# Patient Record
Sex: Male | Born: 1955 | Race: White | Hispanic: No | Marital: Married | Smoking: Current every day smoker
Health system: Southern US, Community
[De-identification: ages and names within clinical notes are randomized; demographics above are authoritative.]

## PROBLEM LIST (undated history)

## (undated) DIAGNOSIS — E7800 Pure hypercholesterolemia, unspecified: Secondary | ICD-10-CM

## (undated) DIAGNOSIS — E785 Hyperlipidemia, unspecified: Secondary | ICD-10-CM

## (undated) DIAGNOSIS — H409 Unspecified glaucoma: Secondary | ICD-10-CM

## (undated) DIAGNOSIS — C61 Malignant neoplasm of prostate: Secondary | ICD-10-CM

## (undated) HISTORY — DX: Malignant neoplasm of prostate: C61

## (undated) HISTORY — PX: HERNIA REPAIR: SHX51

## (undated) HISTORY — PX: APPENDECTOMY: SHX54

## (undated) HISTORY — DX: Hyperlipidemia, unspecified: E78.5

## (undated) HISTORY — DX: Unspecified glaucoma: H40.9

## (undated) HISTORY — PX: PROSTATECTOMY: SHX69

---

## 2009-01-11 ENCOUNTER — Ambulatory Visit: Payer: Self-pay | Admitting: Gastroenterology

## 2009-12-09 ENCOUNTER — Inpatient Hospital Stay: Payer: Self-pay | Admitting: Internal Medicine

## 2009-12-09 IMAGING — CR DG CHEST 2V
1 series · 2 of 2 positions shown · non-contrast
Comparison: none

REASON FOR EXAM: syncope  AMS
COMMENTS:

[Series 1: view not recorded · 0.17mm/px · 2 of 2 slices shown]
[im 1/2]
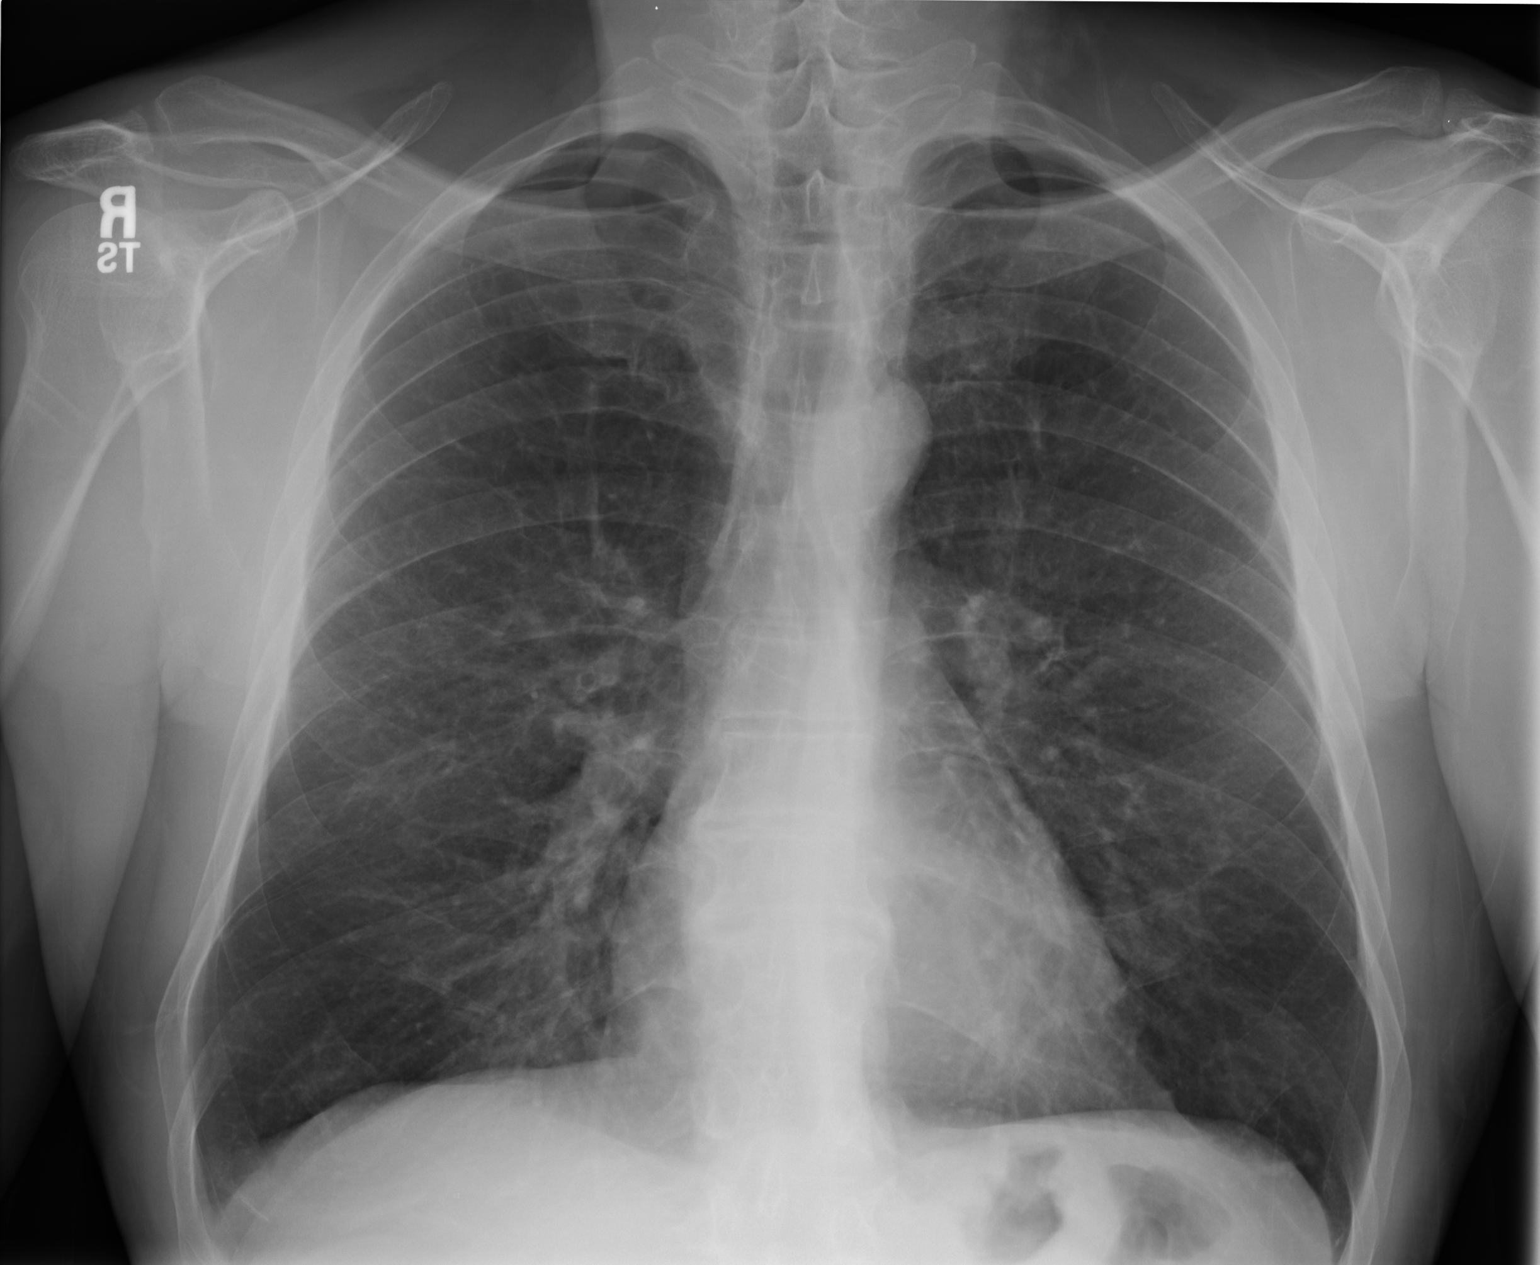
[im 2/2]
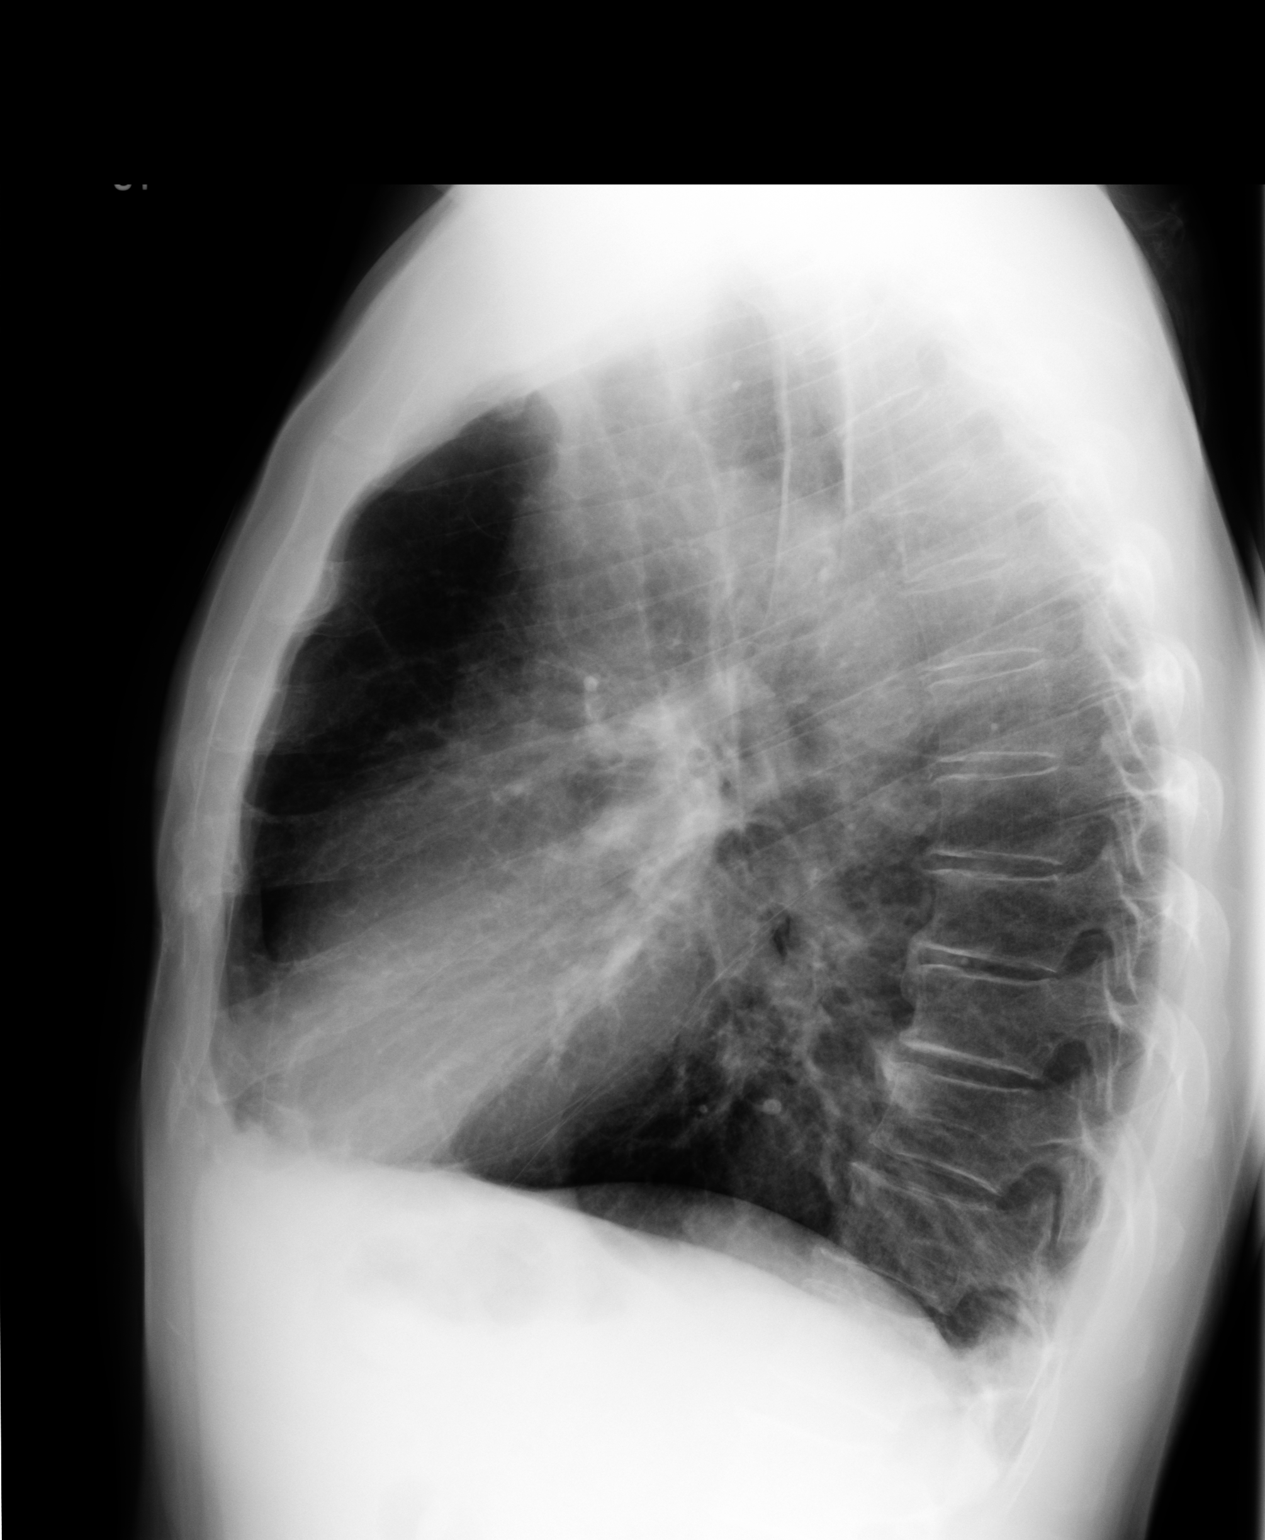

[2 of 2 positions shown; findings below may reference images not displayed]

PROCEDURE:     DXR - DXR CHEST PA (OR AP) AND LATERAL  - [DATE]  [DATE]

RESULT:     PA and lateral views of the chest were obtained. There are no
prior chest radiographs for comparison. The current exam shows the lung
fields to be clear of infiltrate. There is mild thickening of the
interstitial lung markings, consistent with interstitial fibrotic change.
The chest is hyperexpanded compatible with a history of COPD or asthma.
Heart size is normal. No acute bony abnormalities are seen.
IMPRESSION: 1. No acute changes are identified.
2. The chest is hyperexpanded compatible with a history of COPD or asthma.
3. There is mild thickening of the interstitial lung markings bilaterally,
consistent with mild interstitial fibrotic change.

## 2015-08-13 ENCOUNTER — Encounter: Payer: Self-pay | Admitting: Emergency Medicine

## 2015-08-13 ENCOUNTER — Emergency Department
Admission: EM | Admit: 2015-08-13 | Discharge: 2015-08-13 | Disposition: A | Discharge status: Home / Self Care | Payer: Managed Care, Other (non HMO)

## 2015-08-13 NOTE — ED Notes (Signed)
Patient reminded to wash his hands. Patient and wife placed in Family Waiting Room

## 2015-08-13 NOTE — ED Notes (Signed)
Patient has summary from [facility] with him. Diagnosis: Zoster

## 2015-08-13 NOTE — ED Notes (Signed)
Pt with rash right side top of head, right eye.

## 2015-08-13 NOTE — ED Notes (Signed)
Patient here complaining of shingles, diagnosed at [facility].   Patient states "the pain pills they gave me just aren't cutting it". Patient states he was told to see an opthamolgist, or his PCP.  Patient has no PCP. Here for pain control and to see an opthamologist. Right eyelid swollen with drainage.  Healing rash on forehead. Visual acuity R eye 20/50, left eye 20/30

## 2015-08-13 NOTE — ED Notes (Signed)
PA in to see pt. pts wife stated that they were here to see ophthalmologist . Stated that they had called (did not specify who or where) and were told that there was an ophthalmologist here. Were very upset that there was no ophthalmologist here, that they were on call, and pt would have to be seen by ER MD first to get a referral. Asked for their co-pay back, it was refunded, and they left before being seen.  When I assessed pt., wife told me that [facility] and were told to see an ophthalmologist. They said they did not have one and were then told to come to the ER. Wife told same story to triage.

## 2015-10-25 ENCOUNTER — Encounter: Payer: Self-pay | Admitting: *Deleted

## 2015-10-28 ENCOUNTER — Ambulatory Visit: Payer: Managed Care, Other (non HMO) | Admitting: Anesthesiology

## 2015-10-28 ENCOUNTER — Encounter: Payer: Self-pay | Admitting: *Deleted

## 2015-10-28 ENCOUNTER — Encounter
Admission: RE | Disposition: A | Discharge status: Home / Self Care | Payer: Self-pay | Source: Ambulatory Visit | Attending: Gastroenterology

## 2015-10-28 ENCOUNTER — Ambulatory Visit
Admission: RE | Admit: 2015-10-28 | Discharge: 2015-10-28 | Disposition: A | Discharge status: Home / Self Care | Payer: Managed Care, Other (non HMO) | Source: Ambulatory Visit | Attending: Gastroenterology | Admitting: Gastroenterology

## 2015-10-28 DIAGNOSIS — F17200 Nicotine dependence, unspecified, uncomplicated: Secondary | ICD-10-CM | POA: Insufficient documentation

## 2015-10-28 DIAGNOSIS — Z7982 Long term (current) use of aspirin: Secondary | ICD-10-CM | POA: Diagnosis not present

## 2015-10-28 DIAGNOSIS — K642 Third degree hemorrhoids: Secondary | ICD-10-CM | POA: Diagnosis not present

## 2015-10-28 DIAGNOSIS — E7800 Pure hypercholesterolemia, unspecified: Secondary | ICD-10-CM | POA: Diagnosis not present

## 2015-10-28 DIAGNOSIS — Z86010 Personal history of colonic polyps: Secondary | ICD-10-CM | POA: Diagnosis not present

## 2015-10-28 DIAGNOSIS — Z79899 Other long term (current) drug therapy: Secondary | ICD-10-CM | POA: Insufficient documentation

## 2015-10-28 DIAGNOSIS — Z1211 Encounter for screening for malignant neoplasm of colon: Secondary | ICD-10-CM | POA: Insufficient documentation

## 2015-10-28 DIAGNOSIS — K635 Polyp of colon: Secondary | ICD-10-CM | POA: Insufficient documentation

## 2015-10-28 HISTORY — PX: COLONOSCOPY WITH PROPOFOL: SHX5780

## 2015-10-28 HISTORY — DX: Pure hypercholesterolemia, unspecified: E78.00

## 2015-10-28 SURGERY — COLONOSCOPY WITH PROPOFOL
Anesthesia: General

## 2015-10-28 MED ORDER — PROPOFOL 10 MG/ML IV BOLUS
INTRAVENOUS | Status: DC | PRN
  Administered 2015-10-28: 30 mg via INTRAVENOUS
  Administered 2015-10-28: 20 mg via INTRAVENOUS

## 2015-10-28 MED ORDER — PHENYLEPHRINE HCL 10 MG/ML IJ SOLN
INTRAMUSCULAR | Status: DC | PRN
  Administered 2015-10-28: 100 ug via INTRAVENOUS

## 2015-10-28 MED ORDER — MIDAZOLAM HCL 2 MG/2ML IJ SOLN
INTRAMUSCULAR | Status: DC | PRN
  Administered 2015-10-28: 1 mg via INTRAVENOUS

## 2015-10-28 MED ORDER — FENTANYL CITRATE (PF) 100 MCG/2ML IJ SOLN
INTRAMUSCULAR | Status: DC | PRN
  Administered 2015-10-28: 50 ug via INTRAVENOUS

## 2015-10-28 MED ORDER — SODIUM CHLORIDE 0.9 % IV SOLN
INTRAVENOUS | Status: DC
  Administered 2015-10-28 (×2): via INTRAVENOUS

## 2015-10-28 MED ORDER — PROPOFOL 500 MG/50ML IV EMUL
INTRAVENOUS | Status: DC | PRN
  Administered 2015-10-28: 100 ug/kg/min via INTRAVENOUS

## 2015-10-28 NOTE — H&P (Signed)
Outpatient short stay form Pre-procedure 10/28/2015 1:42 PM Lollie Sails MD  Primary Physician: Dr. Frazier Richards  Reason for visit:  Colonoscopy  History of present illness:  Patient is a 60 year old male with a personal history of adenomatous colon polyps. Presenting today for repeat procedure. He tolerated his prep well. He takes no aspirin or    Current facility-administered medications:  .  0.9 %  sodium chloride infusion, , Intravenous, Continuous, Lollie Sails, MD, Last Rate: 20 mL/hr at 10/28/15 1257  Prescriptions prior to admission  Medication Sig Dispense Refill Last Dose  . aspirin 81 MG tablet Take 81 mg by mouth daily.   Past Week at Unknown time  . azelastine (ASTELIN) 0.1 % nasal spray Place into both nostrils 2 (two) times daily. Use in each nostril as directed   Past Week at Unknown time  . pravastatin (PRAVACHOL) 40 MG tablet Take 40 mg by mouth daily.   Past Week at Unknown time  . valACYclovir (VALTREX) 1000 MG tablet Take 1,000 mg by mouth 3 (three) times daily.   Past Month at Unknown time  . cetirizine (ZYRTEC) 10 MG tablet Take 10 mg by mouth daily. Reported on 10/28/2015   Not Taking at Unknown time  . finasteride (PROSCAR) 5 MG tablet Take 5 mg by mouth daily. Reported on 10/28/2015   Not Taking at Unknown time  . traMADol (ULTRAM) 50 MG tablet Take 50 mg by mouth every 4 (four) hours. Reported on 10/28/2015   Not Taking at Unknown time     Allergies  Allergen Reactions  . Percocet [Oxycodone-Acetaminophen] Other (See Comments)    Dizzy,and clammy     Past Medical History  Diagnosis Date  . Hypercholesteremia     Review of systems:      Physical Exam    Heart and lungs: Regular rate and rhythm without rub or gallop, lungs are bilaterally clear.    HEENT: Normocephalic atraumatic eyes are anicteric    Other:     Pertinant exam for procedure: Soft nontender nondistended bowel sounds positive normoactive.    Planned proceedures:  Colonoscopy and indicated procedures.Telephone GI clinic in one week for pathology results    Lollie Sails, MD Gastroenterology 10/28/2015  1:42 PM

## 2015-10-28 NOTE — Op Note (Signed)
[facility]astroenterology Patient Name: Aaron Barron Procedure Date: 10/28/2015 1:03 PM MRN: [id number] Account #: [id number] Date of Birth: 09-Jul-1956 Admit Type: Outpatient Age: 60 Room: [facility] ENDO ROOM 3 Gender: Male Note Status: Finalized Procedure:            Colonoscopy Indications:          Personal history of colonic polyps Providers:            Lollie Sails, MD Referring MD:         Ocie Cornfield. Ouida Sills, MD (Referring MD) Medicines:            Monitored Anesthesia Care Complications:        No immediate complications. Procedure:            Pre-Anesthesia Assessment:                       - ASA Grade Assessment: II - A patient with mild                        systemic disease.                       After obtaining informed consent, the colonoscope was                        passed under direct vision. Throughout the procedure,                        the patient's blood pressure, pulse, and oxygen                        saturations were monitored continuously. The                        Colonoscope was introduced through the anus and                        advanced to the the cecum, identified by appendiceal                        orifice and ileocecal valve. The colonoscopy was                        performed without difficulty. The patient tolerated the                        procedure well. The quality of the bowel preparation                        was fair. Findings:      Three sessile polyps were found in the distal sigmoid colon. The polyps       were less than 1 mm in size. These polyps were removed with a cold       biopsy forceps. Resection and retrieval were complete.      Non-bleeding internal hemorrhoids were found during perianal exam and       during anoscopy. The hemorrhoids were small and Grade III (internal       hemorrhoids that prolapse but require manual reduction).      The exam was otherwise without  abnormality. Impression:           -  Preparation of the colon was fair.                       - Three less than 1 mm polyps in the distal sigmoid                        colon, removed with a cold biopsy forceps. Resected and                        retrieved.                       - Non-bleeding internal hemorrhoids.                       - The examination was otherwise normal. Recommendation:       - Discharge patient to home.                       - Use Analpram HC Cream 2.5%: Apply externally TID for                        10 days.                       - Return to GI clinic in 4 weeks. Procedure Code(s):    --- Professional ---                       [phone], Colonoscopy, flexible; with biopsy, single or                        multiple Diagnosis Code(s):    --- Professional ---                       K64.1, Second degree hemorrhoids                       D12.5, Benign neoplasm of sigmoid colon                       Z86.010, Personal history of colonic polyps CPT copyright 2016 American Medical Association. All rights reserved. The codes documented in this report are preliminary and upon coder review may  be revised to meet current compliance requirements. Lollie Sails, MD 10/28/2015 1:40:33 PM This report has been signed electronically. Number of Addenda: 0 Note Initiated On: 10/28/2015 1:03 PM Scope Withdrawal Time: 0 hours 15 minutes 20 seconds  Total Procedure Duration: 0 hours 23 minutes 21 seconds       [facility]

## 2015-10-28 NOTE — Transfer of Care (Signed)
Immediate Anesthesia Transfer of Care Note  Patient: Aaron Barron  Procedure(s) Performed: Procedure(s): COLONOSCOPY WITH PROPOFOL (N/A)  Patient Location: PACU  Anesthesia Type:General  Level of Consciousness: awake, alert  and oriented  Airway & Oxygen Therapy: Patient Spontanous Breathing and Patient connected to nasal cannula oxygen  Post-op Assessment: Report given to RN and Post -op Vital signs reviewed and stable  Post vital signs: Reviewed and stable  Last Vitals:  Filed Vitals:   10/28/15 1242  BP: 118/67  Pulse: 69  Temp: 35.6 C  Resp: 20    Complications: No apparent anesthesia complications

## 2015-10-28 NOTE — Anesthesia Preprocedure Evaluation (Signed)
Anesthesia Evaluation  Patient identified by MRN, date of birth, ID band Patient awake    Reviewed: Allergy & Precautions, NPO status , Patient's Chart, lab work & pertinent test results  History of Anesthesia Complications Negative for: history of anesthetic complications  Airway Mallampati: II       Dental  (+) Chipped, Missing, Poor Dentition   Pulmonary neg pulmonary ROS, Current Smoker,           Cardiovascular negative cardio ROS       Neuro/Psych negative neurological ROS     GI/Hepatic negative GI ROS, Neg liver ROS,   Endo/Other  negative endocrine ROS  Renal/GU negative Renal ROS     Musculoskeletal   Abdominal   Peds  Hematology negative hematology ROS (+)   Anesthesia Other Findings   Reproductive/Obstetrics                             Anesthesia Physical Anesthesia Plan  ASA: II  Anesthesia Plan: General   Post-op Pain Management:    Induction: Intravenous  Airway Management Planned: Nasal Cannula  Additional Equipment:   Intra-op Plan:   Post-operative Plan:   Informed Consent: I have reviewed the patients History and Physical, chart, labs and discussed the procedure including the risks, benefits and alternatives for the proposed anesthesia with the patient or authorized representative who has indicated his/her understanding and acceptance.     Plan Discussed with:   Anesthesia Plan Comments:         Anesthesia Quick Evaluation

## 2015-10-29 ENCOUNTER — Encounter: Payer: Self-pay | Admitting: Gastroenterology

## 2015-10-29 LAB — SURGICAL PATHOLOGY

## 2015-10-29 NOTE — Anesthesia Postprocedure Evaluation (Signed)
Anesthesia Post Note  Patient: Aaron Barron  Procedure(s) Performed: Procedure(s) (LRB): COLONOSCOPY WITH PROPOFOL (N/A)  Patient location during evaluation: PACU Anesthesia Type: General Level of consciousness: awake and alert and oriented Pain management: pain level controlled Vital Signs Assessment: post-procedure vital signs reviewed and stable Respiratory status: spontaneous breathing Cardiovascular status: blood pressure returned to baseline Anesthetic complications: no    Last Vitals:  Filed Vitals:   10/28/15 1410 10/28/15 1420  BP: 115/76 110/77  Pulse: 67 61  Temp:    Resp: 18 16    Last Pain:  Filed Vitals:   10/29/15 0731  PainSc: 0-No pain                 Tonyia Marschall

## 2017-10-05 ENCOUNTER — Ambulatory Visit: Payer: Self-pay | Admitting: Urology

## 2017-10-15 ENCOUNTER — Ambulatory Visit (INDEPENDENT_AMBULATORY_CARE_PROVIDER_SITE_OTHER): Payer: Managed Care, Other (non HMO) | Admitting: Urology

## 2017-10-15 ENCOUNTER — Encounter: Payer: Self-pay | Admitting: Urology

## 2017-10-15 VITALS — BP 114/75 | HR 73 | Ht 66.0 in | Wt 152.0 lb

## 2017-10-15 DIAGNOSIS — C61 Malignant neoplasm of prostate: Secondary | ICD-10-CM | POA: Diagnosis not present

## 2017-10-15 NOTE — Progress Notes (Signed)
10/15/2017 2:45 PM   [name] 06/27/1956 [id number]  Referring provider: Kirk Ruths, MD [facility] [place], [place] [address]  Chief complaint: Prostate cancer  HPI: Aaron Barron there is a 62 year old male seen in consultation at the request of Dr. Ouida Sills for evaluation of prostate cancer.  He was diagnosed with adenocarcinoma the prostate in 2010.  PSA at the time of diagnosis was 5.4 and DRE was abnormal.  Pathology was Gleason 3+3 localized to the left side.  He underwent RALP in August 2010 but with pathologic pT2c N0 Mx Gleason 3+4 disease which was organ confined.  His PSA was undetectable initially and in 2015 was 0.12.  He was referred to Dr. Darcus Austin at [facility] in 2016 when his PSA was 0.24.  It was felt he most likely had biochemical recurrence with the possibility that the PSA bump could be secondary to benign prostate tissue since he had organ confined disease.  Additional PSA monitoring was recommended with an recommendation of radiation oncology referral if his PSA rose to 0.5 or above.  His most recent PSA results are as follows: 06/2014    0.12 12/2014      0.24 04/2015      0.29 09/2015      0.48 08/2016      1.20 09/2017      2.00  PMH: Past Medical History:  Diagnosis Date  . Glaucoma   . Hypercholesteremia   . Hyperlipidemia   . Prostate cancer [name])     Surgical History: Past Surgical History:  Procedure Laterality Date  . APPENDECTOMY    . COLONOSCOPY WITH PROPOFOL N/A 10/28/2015   Procedure: COLONOSCOPY WITH PROPOFOL;  Surgeon: Lollie Sails, MD;  Location: [facility] ENDOSCOPY;  Service: Endoscopy;  Laterality: N/A;  . HERNIA REPAIR    . PROSTATECTOMY     "it was cancer"    Home Medications:  Allergies as of 10/15/2017      Reactions   Percocet [oxycodone-acetaminophen] Other (See Comments)   Dizzy,and clammy      Medication List        Accurate as of 10/15/17  2:45 PM. Always use your most recent  med list.          aspirin 81 MG tablet Take 81 mg by mouth daily.   azelastine 0.1 % nasal spray Commonly known as:  ASTELIN Place into both nostrils 2 (two) times daily. Use in each nostril as directed   finasteride 5 MG tablet Commonly known as:  PROSCAR Take 5 mg by mouth daily. Reported on 10/28/2015   pravastatin 40 MG tablet Commonly known as:  PRAVACHOL Take 40 mg by mouth daily.   valACYclovir 1000 MG tablet Commonly known as:  VALTREX Take 1,000 mg by mouth 3 (three) times daily.       Allergies:  Allergies  Allergen Reactions  . Percocet [Oxycodone-Acetaminophen] Other (See Comments)    Dizzy,and clammy    Family History: No family history on file.  Social History:  reports that he has been smoking cigarettes.  He has a 30.00 pack-year smoking history. he has never used smokeless tobacco. He reports that he does not drink alcohol or use drugs.  ROS: UROLOGY Frequent Urination?: Yes Hard to postpone urination?: Yes Burning/pain with urination?: No Get up at night to urinate?: Yes Leakage of urine?: Yes Urine stream starts and stops?: No Trouble starting stream?: No Do you have to strain to urinate?: No Blood in  urine?: No Urinary tract infection?: No Sexually transmitted disease?: No Injury to kidneys or bladder?: No Painful intercourse?: No Weak stream?: No Erection problems?: Yes Penile pain?: No  Gastrointestinal Nausea?: No Vomiting?: No Indigestion/heartburn?: No Diarrhea?: No Constipation?: No  Constitutional Fever: No Night sweats?: No Weight loss?: No Fatigue?: No  Skin Skin rash/lesions?: No Itching?: No  Eyes Blurred vision?: No Double vision?: No  Ears/Nose/Throat Sore throat?: No Sinus problems?: No  Hematologic/Lymphatic Swollen glands?: No Easy bruising?: No  Cardiovascular Leg swelling?: No Chest pain?: No  Respiratory Cough?: Yes Shortness of breath?: No  Endocrine Excessive thirst?:  No  Musculoskeletal Back pain?: No Joint pain?: No  Neurological Headaches?: No Dizziness?: No  Psychologic Depression?: No Anxiety?: No  Physical Exam: BP 114/75   Pulse 73   Ht 5\' 6"  (1.676 m)   Wt 152 lb (68.9 kg)   BMI 24.53 kg/m   Constitutional:  Alert and oriented, No acute distress. HEENT: [place] AT, moist mucus membranes.  Trachea midline, no masses. Cardiovascular: No clubbing, cyanosis, or edema. Respiratory: Normal respiratory effort, no increased work of breathing. GI: Abdomen is soft, nontender, nondistended, no abdominal masses GU: No CVA tenderness Skin: No rashes, bruises or suspicious lesions. Neurologic: Grossly intact, no focal deficits, moving all 4 extremities. Psychiatric: Normal mood and affect.   Assessment & Plan:   61 year old male with history of prostate cancer and a rising PSA after radical prostatectomy.  Discussed with Mr. Coia and his wife his rising PSA is indicative of biochemical recurrence.  I recommended a CT of the abdomen and pelvis to evaluate for adenopathy.  They also requested radiation oncology referral.    Abbie Sons, [facility] [address] [place], [place] [address] [phone]

## 2017-10-25 ENCOUNTER — Institutional Professional Consult (permissible substitution): Payer: Managed Care, Other (non HMO) | Admitting: Radiation Oncology

## 2017-10-26 ENCOUNTER — Ambulatory Visit
Admission: RE | Admit: 2017-10-26 | Discharge: 2017-10-26 | Disposition: A | Discharge status: Home / Self Care | Payer: Managed Care, Other (non HMO) | Source: Ambulatory Visit | Attending: Urology | Admitting: Urology

## 2017-10-26 ENCOUNTER — Telehealth: Payer: Self-pay | Admitting: Urology

## 2017-10-26 DIAGNOSIS — K6289 Other specified diseases of anus and rectum: Secondary | ICD-10-CM | POA: Insufficient documentation

## 2017-10-26 DIAGNOSIS — C61 Malignant neoplasm of prostate: Secondary | ICD-10-CM

## 2017-10-26 DIAGNOSIS — N3289 Other specified disorders of bladder: Secondary | ICD-10-CM | POA: Diagnosis not present

## 2017-10-26 DIAGNOSIS — K5730 Diverticulosis of large intestine without perforation or abscess without bleeding: Secondary | ICD-10-CM | POA: Diagnosis not present

## 2017-10-26 DIAGNOSIS — K7689 Other specified diseases of liver: Secondary | ICD-10-CM | POA: Diagnosis not present

## 2017-10-26 DIAGNOSIS — Z8546 Personal history of malignant neoplasm of prostate: Secondary | ICD-10-CM | POA: Diagnosis not present

## 2017-10-26 DIAGNOSIS — I700 Atherosclerosis of aorta: Secondary | ICD-10-CM | POA: Diagnosis not present

## 2017-10-26 LAB — POCT I-STAT CREATININE: CREATININE: 0.9 mg/dL (ref 0.61–1.24)

## 2017-10-26 MED ORDER — IOPAMIDOL (ISOVUE-300) INJECTION 61%
100.0000 mL | Freq: Once | INTRAVENOUS | Status: AC | PRN
  Administered 2017-10-26: 100 mL via INTRAVENOUS

## 2017-10-26 NOTE — Telephone Encounter (Signed)
Patient had his CT scan today and he has already seen the doctor at the cancer center. Do you want to bring him back in to go over results or call him?   Sharyn Lull

## 2017-10-26 NOTE — Telephone Encounter (Signed)
CT scan showed no enlarged pelvic lymph nodes or obvious mass in the pelvis indicative of prostate cancer recurrence.  Based on his PSA results the recurrence is most likely microscopic.  He does not need a follow-up here for the results.  It looks like he has an appointment scheduled in radiation oncology on April 12.

## 2017-10-27 NOTE — Telephone Encounter (Signed)
Spoke with patient and all questions answered ° ° °Aaron Barron ° °

## 2017-11-12 ENCOUNTER — Other Ambulatory Visit: Payer: Self-pay

## 2017-11-12 ENCOUNTER — Other Ambulatory Visit: Payer: Self-pay | Admitting: *Deleted

## 2017-11-12 ENCOUNTER — Ambulatory Visit
Admission: RE | Admit: 2017-11-12 | Discharge: 2017-11-12 | Disposition: A | Discharge status: Home / Self Care | Payer: Managed Care, Other (non HMO) | Source: Ambulatory Visit | Attending: Radiation Oncology | Admitting: Radiation Oncology

## 2017-11-12 ENCOUNTER — Encounter: Payer: Self-pay | Admitting: Radiation Oncology

## 2017-11-12 VITALS — BP 115/75 | HR 75 | Resp 20 | Wt 153.8 lb

## 2017-11-12 DIAGNOSIS — Z8546 Personal history of malignant neoplasm of prostate: Secondary | ICD-10-CM | POA: Diagnosis not present

## 2017-11-12 DIAGNOSIS — C61 Malignant neoplasm of prostate: Secondary | ICD-10-CM | POA: Insufficient documentation

## 2017-11-12 DIAGNOSIS — Z9889 Other specified postprocedural states: Secondary | ICD-10-CM | POA: Diagnosis not present

## 2017-11-12 DIAGNOSIS — Z79899 Other long term (current) drug therapy: Secondary | ICD-10-CM | POA: Diagnosis not present

## 2017-11-12 DIAGNOSIS — E785 Hyperlipidemia, unspecified: Secondary | ICD-10-CM | POA: Diagnosis not present

## 2017-11-12 DIAGNOSIS — Z7982 Long term (current) use of aspirin: Secondary | ICD-10-CM | POA: Diagnosis not present

## 2017-11-12 DIAGNOSIS — F17210 Nicotine dependence, cigarettes, uncomplicated: Secondary | ICD-10-CM | POA: Diagnosis not present

## 2017-11-12 DIAGNOSIS — Z885 Allergy status to narcotic agent status: Secondary | ICD-10-CM | POA: Diagnosis not present

## 2017-11-12 DIAGNOSIS — R350 Frequency of micturition: Secondary | ICD-10-CM | POA: Diagnosis not present

## 2017-11-12 NOTE — Consult Note (Signed)
NEW PATIENT EVALUATION  Name: Aaron Barron  MRN: [id number]  Date:   11/12/2017     DOB: 1956-04-21   This 62 y.o. male patient presents to the clinic for initial evaluation of biochemical recurrence of prostate cancer status post robotic-assisted prostatectomy in 2010.  REFERRING PHYSICIAN: Kirk Ruths, MD  CHIEF COMPLAINT:  Chief Complaint  Patient presents with  . Prostate Cancer    Pt is here for initial consultation of prostate cancer    DIAGNOSIS: The encounter diagnosis was Malignant neoplasm of prostate ([place]).   PREVIOUS INVESTIGATIONS:  CT scans reviewed bone scan ordered Pathology report reviewed Clinical notes reviewed  HPI: patient is a 62 year old male underwent robotic-assisted prostatectomy back in 2010 at [facility].tumor was a Gleason 7 (3+4). Tumor was prostate confined no seminal vesicle extracapsular spread excessive extension noted. He did have perineural invasion.they've been following biochemical recurrence since 2015 when his PSA is 0.12 is now in February 2019 2.0. He is undergone a CT scan which shows no evidence of metastatic disease there is 1prominent left common iliac node. I have ordered a bone scan. Patient does have some urinary frequency and urgency and urge incontinence. He is seen today accompanied by his wife and is now referred to radiation oncology for consideration of salvage treatment.patient is having no bone pain.  PLANNED TREATMENT REGIMEN: salvage I MRT radiation therapy plus Lupron suppression  PAST MEDICAL HISTORY:  has a past medical history of Glaucoma, Hypercholesteremia, Hyperlipidemia, and Prostate cancer ([place]).    PAST SURGICAL HISTORY:  Past Surgical History:  Procedure Laterality Date  . APPENDECTOMY    . COLONOSCOPY WITH PROPOFOL N/A 10/28/2015   Procedure: COLONOSCOPY WITH PROPOFOL;  Surgeon: Lollie Sails, MD;  Location: [facility] ENDOSCOPY;  Service: Endoscopy;  Laterality: N/A;  . HERNIA REPAIR    .  PROSTATECTOMY     "it was cancer"    FAMILY HISTORY: family history is not on file.  SOCIAL HISTORY:  reports that he has been smoking cigarettes.  He has a 30.00 pack-year smoking history. He has never used smokeless tobacco. He reports that he does not drink alcohol or use drugs.  ALLERGIES: Percocet [oxycodone-acetaminophen]  MEDICATIONS:  Current Outpatient Medications  Medication Sig Dispense Refill  . aspirin 81 MG tablet Take 81 mg by mouth daily.    Marland Kitchen azelastine (ASTELIN) 0.1 % nasal spray Place into both nostrils 2 (two) times daily. Use in each nostril as directed    . finasteride (PROSCAR) 5 MG tablet Take 5 mg by mouth daily. Reported on 10/28/2015    . pravastatin (PRAVACHOL) 40 MG tablet Take 40 mg by mouth daily.    . valACYclovir (VALTREX) 1000 MG tablet Take 1,000 mg by mouth 3 (three) times daily.     No current facility-administered medications for this encounter.     ECOG PERFORMANCE STATUS:  A symptomatically  REVIEW OF SYSTEMS:  Patient denies any weight loss, fatigue, weakness, fever, chills or night sweats. Patient denies any loss of vision, blurred vision. Patient denies any ringing  of the ears or hearing loss. No irregular heartbeat. Patient denies heart murmur or history of fainting. Patient denies any chest pain or pain radiating to her upper extremities. Patient denies any shortness of breath, difficulty breathing at night, cough or hemoptysis. Patient denies any swelling in the lower legs. Patient denies any nausea vomiting, vomiting of blood, or coffee ground material in the vomitus. Patient denies any stomach pain. Patient states has had normal bowel movements  no significant constipation or diarrhea. Patient denies any dysuria, hematuria or significant nocturia. Patient denies any problems walking, swelling in the joints or loss of balance. Patient denies any skin changes, loss of hair or loss of weight. Patient denies any excessive worrying or anxiety or  significant depression. Patient denies any problems with insomnia. Patient denies excessive thirst, polyuria, polydipsia. Patient denies any swollen glands, patient denies easy bruising or easy bleeding. Patient denies any recent infections, allergies or URI. Patient "s visual fields have not changed significantly in recent time.    PHYSICAL EXAM: BP 115/75   Pulse 75   Resp 20   Wt 153 lb 12.3 oz (69.7 kg)   BMI 24.82 kg/m  On rectal exam rectal sphincter tone is good prostatic fossa is clear without evidence of nodularity or mass. Testicular exam is within normal limits. Well-developed well-nourished patient in NAD. HEENT reveals PERLA, EOMI, discs not visualized.  Oral cavity is clear. No oral mucosal lesions are identified. Neck is clear without evidence of cervical or supraclavicular adenopathy. Lungs are clear to A&P. Cardiac examination is essentially unremarkable with regular rate and rhythm without murmur rub or thrill. Abdomen is benign with no organomegaly or masses noted. Motor sensory and DTR levels are equal and symmetric in the upper and lower extremities. Cranial nerves II through XII are grossly intact. Proprioception is intact. No peripheral adenopathy or edema is identified. No motor or sensory levels are noted. Crude visual fields are within normal range.  LABORATORY DATA: pathology reports from 2010 reviewed    RADIOLOGY RESULTS:CT scan reviewed bone scan ordered for baseline study   IMPRESSION: biochemical recurrence of prostate cancer status post robotic-assisted prostatectomy 10617 in 62 year old male  PLAN: at this time I have ordered a bone scan for baseline study. Should that be normal go ahead with salvage radiation therapy to his prostatic fossa and pelvic nodes. Would plan on delivering 7600 cGy to his prostatic fossa and I MRT radiation therapy to his pelvic nodes. I use a slightly higher than normal standard dose of radiation which my clinical experience has  achieved excellent local control without undue significant side effects. I also would like the patient suppressed with Lupron and have ordered a four-month Lupron injection which according to the stampede study has shown benefit in salvage radiation therapy for prostate cancer. Risks and benefits of prostate cancer including increased lower urinary tract symptoms diarrhea fatigue alteration of blood counts skin reaction all were reviewed in detail with the patient and his wife. They both seem to comprehend my treatment plan well. I personally set up and scheduled CT simulation after his bone scan is complete.  I would like to take this opportunity to thank you for allowing me to participate in the care of your patient.Noreene Filbert, MD

## 2017-11-15 ENCOUNTER — Other Ambulatory Visit: Payer: Self-pay | Admitting: *Deleted

## 2017-11-15 DIAGNOSIS — C61 Malignant neoplasm of prostate: Secondary | ICD-10-CM

## 2017-11-17 ENCOUNTER — Encounter
Admission: RE | Admit: 2017-11-17 | Discharge: 2017-11-17 | Disposition: A | Discharge status: Home / Self Care | Payer: Managed Care, Other (non HMO) | Source: Ambulatory Visit | Attending: Radiation Oncology | Admitting: Radiation Oncology

## 2017-11-17 ENCOUNTER — Ambulatory Visit
Admission: RE | Admit: 2017-11-17 | Discharge: 2017-11-17 | Disposition: A | Discharge status: Home / Self Care | Payer: Managed Care, Other (non HMO) | Source: Ambulatory Visit | Attending: Radiation Oncology | Admitting: Radiation Oncology

## 2017-11-17 DIAGNOSIS — C61 Malignant neoplasm of prostate: Secondary | ICD-10-CM | POA: Diagnosis present

## 2017-11-17 MED ORDER — TECHNETIUM TC 99M MEDRONATE IV KIT
20.0000 | PACK | Freq: Once | INTRAVENOUS | Status: AC | PRN
  Administered 2017-11-17: 21.932 via INTRAVENOUS

## 2017-11-23 ENCOUNTER — Inpatient Hospital Stay: Payer: Managed Care, Other (non HMO) | Attending: Radiation Oncology

## 2017-11-23 ENCOUNTER — Ambulatory Visit
Admission: RE | Admit: 2017-11-23 | Discharge: 2017-11-23 | Disposition: A | Discharge status: Home / Self Care | Payer: Managed Care, Other (non HMO) | Source: Ambulatory Visit | Attending: Radiation Oncology | Admitting: Radiation Oncology

## 2017-11-23 DIAGNOSIS — Z5111 Encounter for antineoplastic chemotherapy: Secondary | ICD-10-CM | POA: Insufficient documentation

## 2017-11-23 DIAGNOSIS — C61 Malignant neoplasm of prostate: Secondary | ICD-10-CM | POA: Insufficient documentation

## 2017-11-23 DIAGNOSIS — Z510 Encounter for antineoplastic radiation therapy: Secondary | ICD-10-CM | POA: Diagnosis not present

## 2017-11-23 MED ORDER — LEUPROLIDE ACETATE (4 MONTH) 30 MG IM KIT
30.0000 mg | PACK | Freq: Once | INTRAMUSCULAR | Status: AC
  Administered 2017-11-23: 30 mg via INTRAMUSCULAR
  Filled 2017-11-23: qty 30

## 2017-11-29 DIAGNOSIS — C61 Malignant neoplasm of prostate: Secondary | ICD-10-CM | POA: Diagnosis not present

## 2017-11-30 ENCOUNTER — Other Ambulatory Visit: Payer: Self-pay | Admitting: *Deleted

## 2017-11-30 DIAGNOSIS — C61 Malignant neoplasm of prostate: Secondary | ICD-10-CM

## 2017-12-02 ENCOUNTER — Ambulatory Visit
Admission: RE | Admit: 2017-12-02 | Discharge: 2017-12-02 | Disposition: A | Discharge status: Home / Self Care | Payer: Managed Care, Other (non HMO) | Source: Ambulatory Visit | Attending: Radiation Oncology | Admitting: Radiation Oncology

## 2017-12-02 DIAGNOSIS — C61 Malignant neoplasm of prostate: Secondary | ICD-10-CM | POA: Insufficient documentation

## 2017-12-02 DIAGNOSIS — Z510 Encounter for antineoplastic radiation therapy: Secondary | ICD-10-CM | POA: Insufficient documentation

## 2017-12-06 ENCOUNTER — Ambulatory Visit
Admission: RE | Admit: 2017-12-06 | Discharge: 2017-12-06 | Disposition: A | Discharge status: Home / Self Care | Payer: Managed Care, Other (non HMO) | Source: Ambulatory Visit | Attending: Radiation Oncology | Admitting: Radiation Oncology

## 2017-12-06 DIAGNOSIS — C61 Malignant neoplasm of prostate: Secondary | ICD-10-CM | POA: Diagnosis present

## 2017-12-06 DIAGNOSIS — Z510 Encounter for antineoplastic radiation therapy: Secondary | ICD-10-CM | POA: Diagnosis not present

## 2017-12-07 ENCOUNTER — Ambulatory Visit
Admission: RE | Admit: 2017-12-07 | Discharge: 2017-12-07 | Disposition: A | Discharge status: Home / Self Care | Payer: Managed Care, Other (non HMO) | Source: Ambulatory Visit | Attending: Radiation Oncology | Admitting: Radiation Oncology

## 2017-12-07 DIAGNOSIS — Z510 Encounter for antineoplastic radiation therapy: Secondary | ICD-10-CM | POA: Diagnosis not present

## 2017-12-08 ENCOUNTER — Ambulatory Visit
Admission: RE | Admit: 2017-12-08 | Discharge: 2017-12-08 | Disposition: A | Discharge status: Home / Self Care | Payer: Managed Care, Other (non HMO) | Source: Ambulatory Visit | Attending: Radiation Oncology | Admitting: Radiation Oncology

## 2017-12-08 DIAGNOSIS — Z510 Encounter for antineoplastic radiation therapy: Secondary | ICD-10-CM | POA: Diagnosis not present

## 2017-12-09 ENCOUNTER — Ambulatory Visit
Admission: RE | Admit: 2017-12-09 | Discharge: 2017-12-09 | Disposition: A | Discharge status: Home / Self Care | Payer: Managed Care, Other (non HMO) | Source: Ambulatory Visit | Attending: Radiation Oncology | Admitting: Radiation Oncology

## 2017-12-09 DIAGNOSIS — Z510 Encounter for antineoplastic radiation therapy: Secondary | ICD-10-CM | POA: Diagnosis not present

## 2017-12-10 ENCOUNTER — Ambulatory Visit
Admission: RE | Admit: 2017-12-10 | Discharge: 2017-12-10 | Disposition: A | Discharge status: Home / Self Care | Payer: Managed Care, Other (non HMO) | Source: Ambulatory Visit | Attending: Radiation Oncology | Admitting: Radiation Oncology

## 2017-12-10 DIAGNOSIS — Z510 Encounter for antineoplastic radiation therapy: Secondary | ICD-10-CM | POA: Diagnosis not present

## 2017-12-13 ENCOUNTER — Ambulatory Visit
Admission: RE | Admit: 2017-12-13 | Discharge: 2017-12-13 | Disposition: A | Discharge status: Home / Self Care | Payer: Managed Care, Other (non HMO) | Source: Ambulatory Visit | Attending: Radiation Oncology | Admitting: Radiation Oncology

## 2017-12-13 DIAGNOSIS — Z510 Encounter for antineoplastic radiation therapy: Secondary | ICD-10-CM | POA: Diagnosis not present

## 2017-12-14 ENCOUNTER — Ambulatory Visit
Admission: RE | Admit: 2017-12-14 | Discharge: 2017-12-14 | Disposition: A | Discharge status: Home / Self Care | Payer: Managed Care, Other (non HMO) | Source: Ambulatory Visit | Attending: Radiation Oncology | Admitting: Radiation Oncology

## 2017-12-14 DIAGNOSIS — Z510 Encounter for antineoplastic radiation therapy: Secondary | ICD-10-CM | POA: Diagnosis not present

## 2017-12-15 ENCOUNTER — Ambulatory Visit
Admission: RE | Admit: 2017-12-15 | Discharge: 2017-12-15 | Disposition: A | Discharge status: Home / Self Care | Payer: Managed Care, Other (non HMO) | Source: Ambulatory Visit | Attending: Radiation Oncology | Admitting: Radiation Oncology

## 2017-12-15 DIAGNOSIS — Z510 Encounter for antineoplastic radiation therapy: Secondary | ICD-10-CM | POA: Diagnosis not present

## 2017-12-16 ENCOUNTER — Ambulatory Visit
Admission: RE | Admit: 2017-12-16 | Discharge: 2017-12-16 | Disposition: A | Discharge status: Home / Self Care | Payer: Managed Care, Other (non HMO) | Source: Ambulatory Visit | Attending: Radiation Oncology | Admitting: Radiation Oncology

## 2017-12-16 DIAGNOSIS — Z510 Encounter for antineoplastic radiation therapy: Secondary | ICD-10-CM | POA: Diagnosis not present

## 2017-12-17 ENCOUNTER — Ambulatory Visit
Admission: RE | Admit: 2017-12-17 | Discharge: 2017-12-17 | Disposition: A | Discharge status: Home / Self Care | Payer: Managed Care, Other (non HMO) | Source: Ambulatory Visit | Attending: Radiation Oncology | Admitting: Radiation Oncology

## 2017-12-17 DIAGNOSIS — Z510 Encounter for antineoplastic radiation therapy: Secondary | ICD-10-CM | POA: Diagnosis not present

## 2017-12-20 ENCOUNTER — Ambulatory Visit
Admission: RE | Admit: 2017-12-20 | Discharge: 2017-12-20 | Disposition: A | Discharge status: Home / Self Care | Payer: Managed Care, Other (non HMO) | Source: Ambulatory Visit | Attending: Radiation Oncology | Admitting: Radiation Oncology

## 2017-12-20 ENCOUNTER — Inpatient Hospital Stay: Payer: Managed Care, Other (non HMO) | Attending: Radiation Oncology

## 2017-12-20 DIAGNOSIS — Z510 Encounter for antineoplastic radiation therapy: Secondary | ICD-10-CM | POA: Diagnosis not present

## 2017-12-21 ENCOUNTER — Other Ambulatory Visit: Payer: Self-pay | Admitting: *Deleted

## 2017-12-21 ENCOUNTER — Ambulatory Visit
Admission: RE | Admit: 2017-12-21 | Discharge: 2017-12-21 | Disposition: A | Discharge status: Home / Self Care | Payer: Managed Care, Other (non HMO) | Source: Ambulatory Visit | Attending: Radiation Oncology | Admitting: Radiation Oncology

## 2017-12-21 DIAGNOSIS — Z510 Encounter for antineoplastic radiation therapy: Secondary | ICD-10-CM | POA: Diagnosis not present

## 2017-12-21 MED ORDER — MIRABEGRON ER 25 MG PO TB24: 25.0000 mg | ORAL_TABLET | Freq: Every day | ORAL | 3 refills | Status: DC

## 2017-12-22 ENCOUNTER — Ambulatory Visit
Admission: RE | Admit: 2017-12-22 | Discharge: 2017-12-22 | Disposition: A | Discharge status: Home / Self Care | Payer: Managed Care, Other (non HMO) | Source: Ambulatory Visit | Attending: Radiation Oncology | Admitting: Radiation Oncology

## 2017-12-22 DIAGNOSIS — Z510 Encounter for antineoplastic radiation therapy: Secondary | ICD-10-CM | POA: Diagnosis not present

## 2017-12-23 ENCOUNTER — Other Ambulatory Visit: Payer: Self-pay | Admitting: *Deleted

## 2017-12-23 ENCOUNTER — Ambulatory Visit
Admission: RE | Admit: 2017-12-23 | Discharge: 2017-12-23 | Disposition: A | Discharge status: Home / Self Care | Payer: Managed Care, Other (non HMO) | Source: Ambulatory Visit | Attending: Radiation Oncology | Admitting: Radiation Oncology

## 2017-12-23 DIAGNOSIS — Z510 Encounter for antineoplastic radiation therapy: Secondary | ICD-10-CM | POA: Diagnosis not present

## 2017-12-23 MED ORDER — PHENAZOPYRIDINE HCL 200 MG PO TABS: 200.0000 mg | ORAL_TABLET | Freq: Three times a day (TID) | ORAL | 0 refills | Status: DC | PRN

## 2017-12-24 ENCOUNTER — Other Ambulatory Visit: Payer: Self-pay | Admitting: *Deleted

## 2017-12-24 ENCOUNTER — Ambulatory Visit
Admission: RE | Admit: 2017-12-24 | Discharge: 2017-12-24 | Disposition: A | Discharge status: Home / Self Care | Payer: Managed Care, Other (non HMO) | Source: Ambulatory Visit | Attending: Radiation Oncology | Admitting: Radiation Oncology

## 2017-12-24 DIAGNOSIS — Z510 Encounter for antineoplastic radiation therapy: Secondary | ICD-10-CM | POA: Diagnosis not present

## 2017-12-24 MED ORDER — PHENAZOPYRIDINE HCL 200 MG PO TABS: 200.0000 mg | ORAL_TABLET | Freq: Three times a day (TID) | ORAL | 1 refills | Status: DC | PRN

## 2017-12-28 ENCOUNTER — Ambulatory Visit
Admission: RE | Admit: 2017-12-28 | Discharge: 2017-12-28 | Disposition: A | Discharge status: Home / Self Care | Payer: Managed Care, Other (non HMO) | Source: Ambulatory Visit | Attending: Radiation Oncology | Admitting: Radiation Oncology

## 2017-12-28 DIAGNOSIS — Z510 Encounter for antineoplastic radiation therapy: Secondary | ICD-10-CM | POA: Diagnosis not present

## 2017-12-29 ENCOUNTER — Ambulatory Visit
Admission: RE | Admit: 2017-12-29 | Discharge: 2017-12-29 | Disposition: A | Discharge status: Home / Self Care | Payer: Managed Care, Other (non HMO) | Source: Ambulatory Visit | Attending: Radiation Oncology | Admitting: Radiation Oncology

## 2017-12-29 DIAGNOSIS — Z510 Encounter for antineoplastic radiation therapy: Secondary | ICD-10-CM | POA: Diagnosis not present

## 2017-12-30 ENCOUNTER — Ambulatory Visit
Admission: RE | Admit: 2017-12-30 | Discharge: 2017-12-30 | Disposition: A | Discharge status: Home / Self Care | Payer: Managed Care, Other (non HMO) | Source: Ambulatory Visit | Attending: Radiation Oncology | Admitting: Radiation Oncology

## 2017-12-30 DIAGNOSIS — Z510 Encounter for antineoplastic radiation therapy: Secondary | ICD-10-CM | POA: Diagnosis not present

## 2017-12-31 ENCOUNTER — Ambulatory Visit
Admission: RE | Admit: 2017-12-31 | Discharge: 2017-12-31 | Disposition: A | Discharge status: Home / Self Care | Payer: Managed Care, Other (non HMO) | Source: Ambulatory Visit | Attending: Radiation Oncology | Admitting: Radiation Oncology

## 2017-12-31 DIAGNOSIS — Z510 Encounter for antineoplastic radiation therapy: Secondary | ICD-10-CM | POA: Diagnosis not present

## 2018-01-03 ENCOUNTER — Inpatient Hospital Stay: Payer: Managed Care, Other (non HMO) | Attending: Radiation Oncology

## 2018-01-03 ENCOUNTER — Ambulatory Visit
Admission: RE | Admit: 2018-01-03 | Discharge: 2018-01-03 | Disposition: A | Discharge status: Home / Self Care | Payer: Managed Care, Other (non HMO) | Source: Ambulatory Visit | Attending: Radiation Oncology | Admitting: Radiation Oncology

## 2018-01-03 DIAGNOSIS — C61 Malignant neoplasm of prostate: Secondary | ICD-10-CM | POA: Diagnosis not present

## 2018-01-03 DIAGNOSIS — Z510 Encounter for antineoplastic radiation therapy: Secondary | ICD-10-CM | POA: Insufficient documentation

## 2018-01-04 ENCOUNTER — Ambulatory Visit
Admission: RE | Admit: 2018-01-04 | Discharge: 2018-01-04 | Disposition: A | Discharge status: Home / Self Care | Payer: Managed Care, Other (non HMO) | Source: Ambulatory Visit | Attending: Radiation Oncology | Admitting: Radiation Oncology

## 2018-01-04 DIAGNOSIS — Z510 Encounter for antineoplastic radiation therapy: Secondary | ICD-10-CM | POA: Diagnosis not present

## 2018-01-05 ENCOUNTER — Ambulatory Visit
Admission: RE | Admit: 2018-01-05 | Discharge: 2018-01-05 | Disposition: A | Discharge status: Home / Self Care | Payer: Managed Care, Other (non HMO) | Source: Ambulatory Visit | Attending: Radiation Oncology | Admitting: Radiation Oncology

## 2018-01-05 DIAGNOSIS — Z510 Encounter for antineoplastic radiation therapy: Secondary | ICD-10-CM | POA: Diagnosis not present

## 2018-01-06 ENCOUNTER — Ambulatory Visit
Admission: RE | Admit: 2018-01-06 | Discharge: 2018-01-06 | Disposition: A | Discharge status: Home / Self Care | Payer: Managed Care, Other (non HMO) | Source: Ambulatory Visit | Attending: Radiation Oncology | Admitting: Radiation Oncology

## 2018-01-06 DIAGNOSIS — Z510 Encounter for antineoplastic radiation therapy: Secondary | ICD-10-CM | POA: Diagnosis not present

## 2018-01-07 ENCOUNTER — Ambulatory Visit
Admission: RE | Admit: 2018-01-07 | Discharge: 2018-01-07 | Disposition: A | Discharge status: Home / Self Care | Payer: Managed Care, Other (non HMO) | Source: Ambulatory Visit | Attending: Radiation Oncology | Admitting: Radiation Oncology

## 2018-01-07 DIAGNOSIS — Z510 Encounter for antineoplastic radiation therapy: Secondary | ICD-10-CM | POA: Diagnosis not present

## 2018-01-10 ENCOUNTER — Ambulatory Visit
Admission: RE | Admit: 2018-01-10 | Discharge: 2018-01-10 | Disposition: A | Discharge status: Home / Self Care | Payer: Managed Care, Other (non HMO) | Source: Ambulatory Visit | Attending: Radiation Oncology | Admitting: Radiation Oncology

## 2018-01-10 DIAGNOSIS — Z510 Encounter for antineoplastic radiation therapy: Secondary | ICD-10-CM | POA: Diagnosis not present

## 2018-01-11 ENCOUNTER — Ambulatory Visit
Admission: RE | Admit: 2018-01-11 | Discharge: 2018-01-11 | Disposition: A | Discharge status: Home / Self Care | Payer: Managed Care, Other (non HMO) | Source: Ambulatory Visit | Attending: Radiation Oncology | Admitting: Radiation Oncology

## 2018-01-11 DIAGNOSIS — Z510 Encounter for antineoplastic radiation therapy: Secondary | ICD-10-CM | POA: Diagnosis not present

## 2018-01-12 ENCOUNTER — Ambulatory Visit
Admission: RE | Admit: 2018-01-12 | Discharge: 2018-01-12 | Disposition: A | Discharge status: Home / Self Care | Payer: Managed Care, Other (non HMO) | Source: Ambulatory Visit | Attending: Radiation Oncology | Admitting: Radiation Oncology

## 2018-01-12 DIAGNOSIS — Z510 Encounter for antineoplastic radiation therapy: Secondary | ICD-10-CM | POA: Diagnosis not present

## 2018-01-13 ENCOUNTER — Ambulatory Visit
Admission: RE | Admit: 2018-01-13 | Discharge: 2018-01-13 | Disposition: A | Discharge status: Home / Self Care | Payer: Managed Care, Other (non HMO) | Source: Ambulatory Visit | Attending: Radiation Oncology | Admitting: Radiation Oncology

## 2018-01-13 DIAGNOSIS — Z510 Encounter for antineoplastic radiation therapy: Secondary | ICD-10-CM | POA: Diagnosis not present

## 2018-01-14 ENCOUNTER — Ambulatory Visit
Admission: RE | Admit: 2018-01-14 | Discharge: 2018-01-14 | Disposition: A | Discharge status: Home / Self Care | Payer: Managed Care, Other (non HMO) | Source: Ambulatory Visit | Attending: Radiation Oncology | Admitting: Radiation Oncology

## 2018-01-14 DIAGNOSIS — Z510 Encounter for antineoplastic radiation therapy: Secondary | ICD-10-CM | POA: Diagnosis not present

## 2018-01-17 ENCOUNTER — Inpatient Hospital Stay: Payer: Managed Care, Other (non HMO)

## 2018-01-17 ENCOUNTER — Ambulatory Visit
Admission: RE | Admit: 2018-01-17 | Discharge: 2018-01-17 | Disposition: A | Discharge status: Home / Self Care | Payer: Managed Care, Other (non HMO) | Source: Ambulatory Visit | Attending: Radiation Oncology | Admitting: Radiation Oncology

## 2018-01-17 DIAGNOSIS — Z510 Encounter for antineoplastic radiation therapy: Secondary | ICD-10-CM | POA: Diagnosis not present

## 2018-01-18 ENCOUNTER — Ambulatory Visit
Admission: RE | Admit: 2018-01-18 | Discharge: 2018-01-18 | Disposition: A | Discharge status: Home / Self Care | Payer: Managed Care, Other (non HMO) | Source: Ambulatory Visit | Attending: Radiation Oncology | Admitting: Radiation Oncology

## 2018-01-18 DIAGNOSIS — Z510 Encounter for antineoplastic radiation therapy: Secondary | ICD-10-CM | POA: Diagnosis not present

## 2018-01-19 ENCOUNTER — Ambulatory Visit
Admission: RE | Admit: 2018-01-19 | Discharge: 2018-01-19 | Disposition: A | Discharge status: Home / Self Care | Payer: Managed Care, Other (non HMO) | Source: Ambulatory Visit | Attending: Radiation Oncology | Admitting: Radiation Oncology

## 2018-01-19 DIAGNOSIS — Z510 Encounter for antineoplastic radiation therapy: Secondary | ICD-10-CM | POA: Diagnosis not present

## 2018-01-20 ENCOUNTER — Ambulatory Visit
Admission: RE | Admit: 2018-01-20 | Discharge: 2018-01-20 | Disposition: A | Discharge status: Home / Self Care | Payer: Managed Care, Other (non HMO) | Source: Ambulatory Visit | Attending: Radiation Oncology | Admitting: Radiation Oncology

## 2018-01-20 DIAGNOSIS — Z510 Encounter for antineoplastic radiation therapy: Secondary | ICD-10-CM | POA: Diagnosis not present

## 2018-01-21 ENCOUNTER — Ambulatory Visit
Admission: RE | Admit: 2018-01-21 | Discharge: 2018-01-21 | Disposition: A | Discharge status: Home / Self Care | Payer: Managed Care, Other (non HMO) | Source: Ambulatory Visit | Attending: Radiation Oncology | Admitting: Radiation Oncology

## 2018-01-21 DIAGNOSIS — Z510 Encounter for antineoplastic radiation therapy: Secondary | ICD-10-CM | POA: Diagnosis not present

## 2018-01-24 ENCOUNTER — Ambulatory Visit
Admission: RE | Admit: 2018-01-24 | Discharge: 2018-01-24 | Disposition: A | Discharge status: Home / Self Care | Payer: Managed Care, Other (non HMO) | Source: Ambulatory Visit | Attending: Radiation Oncology | Admitting: Radiation Oncology

## 2018-01-24 DIAGNOSIS — Z510 Encounter for antineoplastic radiation therapy: Secondary | ICD-10-CM | POA: Diagnosis not present

## 2018-01-25 ENCOUNTER — Ambulatory Visit
Admission: RE | Admit: 2018-01-25 | Discharge: 2018-01-25 | Disposition: A | Discharge status: Home / Self Care | Payer: Managed Care, Other (non HMO) | Source: Ambulatory Visit | Attending: Radiation Oncology | Admitting: Radiation Oncology

## 2018-01-25 DIAGNOSIS — Z510 Encounter for antineoplastic radiation therapy: Secondary | ICD-10-CM | POA: Diagnosis not present

## 2018-01-26 ENCOUNTER — Ambulatory Visit
Admission: RE | Admit: 2018-01-26 | Discharge: 2018-01-26 | Disposition: A | Discharge status: Home / Self Care | Payer: Managed Care, Other (non HMO) | Source: Ambulatory Visit | Attending: Radiation Oncology | Admitting: Radiation Oncology

## 2018-01-26 DIAGNOSIS — Z510 Encounter for antineoplastic radiation therapy: Secondary | ICD-10-CM | POA: Diagnosis not present

## 2018-01-27 ENCOUNTER — Ambulatory Visit
Admission: RE | Admit: 2018-01-27 | Discharge: 2018-01-27 | Disposition: A | Discharge status: Home / Self Care | Payer: Managed Care, Other (non HMO) | Source: Ambulatory Visit | Attending: Radiation Oncology | Admitting: Radiation Oncology

## 2018-01-27 DIAGNOSIS — Z510 Encounter for antineoplastic radiation therapy: Secondary | ICD-10-CM | POA: Diagnosis not present

## 2018-03-02 ENCOUNTER — Encounter: Payer: Self-pay | Admitting: Radiation Oncology

## 2018-03-02 ENCOUNTER — Other Ambulatory Visit: Payer: Self-pay | Admitting: *Deleted

## 2018-03-02 ENCOUNTER — Other Ambulatory Visit: Payer: Self-pay

## 2018-03-02 ENCOUNTER — Ambulatory Visit
Admission: RE | Admit: 2018-03-02 | Discharge: 2018-03-02 | Disposition: A | Discharge status: Home / Self Care | Payer: Managed Care, Other (non HMO) | Source: Ambulatory Visit | Attending: Radiation Oncology | Admitting: Radiation Oncology

## 2018-03-02 VITALS — BP 119/71 | HR 81 | Temp 96.8°F | Resp 18 | Wt 155.5 lb

## 2018-03-02 DIAGNOSIS — Z923 Personal history of irradiation: Secondary | ICD-10-CM | POA: Diagnosis not present

## 2018-03-02 DIAGNOSIS — C61 Malignant neoplasm of prostate: Secondary | ICD-10-CM

## 2018-03-02 DIAGNOSIS — Z9079 Acquired absence of other genital organ(s): Secondary | ICD-10-CM | POA: Insufficient documentation

## 2018-03-02 DIAGNOSIS — Z08 Encounter for follow-up examination after completed treatment for malignant neoplasm: Secondary | ICD-10-CM | POA: Insufficient documentation

## 2018-03-02 NOTE — Progress Notes (Signed)
Radiation Oncology Follow up Note  Name: Aaron Barron   Date:   03/02/2018 MRN:  [id number] DOB: Dec 11, 1955    This 62 y.o. male presents to the clinic today for one-month follow-up status post salvage radiation therapy for robotic cystoprostatectomy in 2010 with biochemical failure.  REFERRING PROVIDER: Kirk Ruths, MD  HPI: patient is a 62 year old male now out 1 month having completed salvage radiation therapy status post robotic-assisted prostatectomy 2010 for Gleason 7 (3+4)his PSA had climbed to 2.0. He is seen today in routine follow-up and is doing fairly well still has some mild intermittent diarrhea not using Imodium not using low residue diet. He does have occasional rectal incontinence. He is also having some urinary frequency and urgency although both bowel and bladder function is improving.s  COMPLICATIONS OF TREATMENT: none  FOLLOW UP COMPLIANCE: keeps appointments   PHYSICAL EXAM:  BP 119/71   Pulse 81   Temp (!) 96.8 F (36 C)   Resp 18   Wt 155 lb 8.6 oz (70.5 kg)   BMI 25.10 kg/m  Well-developed well-nourished patient in NAD. HEENT reveals PERLA, EOMI, discs not visualized.  Oral cavity is clear. No oral mucosal lesions are identified. Neck is clear without evidence of cervical or supraclavicular adenopathy. Lungs are clear to A&P. Cardiac examination is essentially unremarkable with regular rate and rhythm without murmur rub or thrill. Abdomen is benign with no organomegaly or masses noted. Motor sensory and DTR levels are equal and symmetric in the upper and lower extremities. Cranial nerves II through XII are grossly intact. Proprioception is intact. No peripheral adenopathy or edema is identified. No motor or sensory levels are noted. Crude visual fields are within normal range.  RADIOLOGY RESULTS: no current films for review  PLAN: at the present time he is doing well I'm going to check on his ADT therapy to make sure there is. I've also asked to see  him back in 3 months with a PSA prior to his visit. Patient states he does not need any medication to help with his bowel or bladder function. Otherwise please was overall progress. Patient knows to call with any concerns at any time.  I would like to take this opportunity to thank you for allowing me to participate in the care of your patient.Noreene Filbert, MD

## 2018-03-30 ENCOUNTER — Inpatient Hospital Stay: Payer: Managed Care, Other (non HMO) | Attending: Radiation Oncology

## 2018-03-30 DIAGNOSIS — Z5111 Encounter for antineoplastic chemotherapy: Secondary | ICD-10-CM | POA: Diagnosis not present

## 2018-03-30 DIAGNOSIS — C61 Malignant neoplasm of prostate: Secondary | ICD-10-CM | POA: Insufficient documentation

## 2018-03-30 MED ORDER — LEUPROLIDE ACETATE (4 MONTH) 30 MG IM KIT
30.0000 mg | PACK | Freq: Once | INTRAMUSCULAR | Status: AC
  Administered 2018-03-30: 30 mg via INTRAMUSCULAR

## 2018-05-25 ENCOUNTER — Other Ambulatory Visit: Payer: Self-pay

## 2018-05-25 ENCOUNTER — Inpatient Hospital Stay: Payer: Managed Care, Other (non HMO) | Attending: Radiation Oncology

## 2018-05-25 DIAGNOSIS — C61 Malignant neoplasm of prostate: Secondary | ICD-10-CM

## 2018-05-25 LAB — PSA: Prostatic Specific Antigen: 0.01 ng/mL (ref 0.00–4.00)

## 2018-06-01 ENCOUNTER — Other Ambulatory Visit: Payer: Self-pay

## 2018-06-01 ENCOUNTER — Encounter: Payer: Self-pay | Admitting: Radiation Oncology

## 2018-06-01 ENCOUNTER — Ambulatory Visit
Admission: RE | Admit: 2018-06-01 | Discharge: 2018-06-01 | Disposition: A | Discharge status: Home / Self Care | Payer: Managed Care, Other (non HMO) | Source: Ambulatory Visit | Attending: Radiation Oncology | Admitting: Radiation Oncology

## 2018-06-01 VITALS — Wt 162.3 lb

## 2018-06-01 DIAGNOSIS — C61 Malignant neoplasm of prostate: Secondary | ICD-10-CM | POA: Diagnosis not present

## 2018-06-01 DIAGNOSIS — Z923 Personal history of irradiation: Secondary | ICD-10-CM | POA: Diagnosis not present

## 2018-06-01 NOTE — Progress Notes (Signed)
Radiation Oncology Follow up Note  Name: Aaron Barron   Date:   06/01/2018 MRN:  [id number] DOB: 28-Sep-1955    This 62 y.o. male presents to the clinic today for four-month follow-up status post salvage radiation therapy status post robotic prostatectomy in 2010 with biochemical failure.  REFERRING PROVIDER: Kirk Ruths, MD  HPI: patient is a 61 year old male now out 5 months having completed salvage radiation therapy status post robotic-assisted prostatectomy 2010 for Gleason 7 (3+4) adenocarcinoma with biochemical failure seen today in routine follow-up he is doing well specifically denies diarrhea dysuria or any other GI/GU complaints. His PSA [id number].  COMPLICATIONS OF TREATMENT: none  FOLLOW UP COMPLIANCE: keeps appointments   PHYSICAL EXAM:  Wt 162 lb 4.1 oz (73.6 kg)   BMI 26.19 kg/m  Well-developed well-nourished patient in NAD. HEENT reveals PERLA, EOMI, discs not visualized.  Oral cavity is clear. No oral mucosal lesions are identified. Neck is clear without evidence of cervical or supraclavicular adenopathy. Lungs are clear to A&P. Cardiac examination is essentially unremarkable with regular rate and rhythm without murmur rub or thrill. Abdomen is benign with no organomegaly or masses noted. Motor sensory and DTR levels are equal and symmetric in the upper and lower extremities. Cranial nerves II through XII are grossly intact. Proprioception is intact. No peripheral adenopathy or edema is identified. No motor or sensory levels are noted. Crude visual fields are within normal range.  RADIOLOGY RESULTS: no current films for review  PLAN: t the present time he is doing well under good biochemical control of his prostate cancer. I've asked to see him back in 6 months for follow-up with repeat PSA prior to that visit. Patient also: Anytime with any concerns.  I would like to take this opportunity to thank you for allowing me to participate in the care of your  patient.Noreene Filbert, MD

## 2018-11-30 ENCOUNTER — Other Ambulatory Visit: Payer: Managed Care, Other (non HMO)

## 2018-12-07 ENCOUNTER — Ambulatory Visit: Payer: Managed Care, Other (non HMO) | Admitting: Radiation Oncology

## 2019-11-29 ENCOUNTER — Other Ambulatory Visit: Payer: Self-pay | Admitting: Internal Medicine

## 2019-12-01 ENCOUNTER — Other Ambulatory Visit: Payer: Self-pay | Admitting: Internal Medicine

## 2019-12-01 DIAGNOSIS — R1030 Lower abdominal pain, unspecified: Secondary | ICD-10-CM

## 2019-12-18 ENCOUNTER — Ambulatory Visit
Admission: RE | Admit: 2019-12-18 | Discharge: 2019-12-18 | Disposition: A | Discharge status: Home / Self Care | Payer: Managed Care, Other (non HMO) | Source: Ambulatory Visit | Attending: Internal Medicine | Admitting: Internal Medicine

## 2019-12-18 ENCOUNTER — Other Ambulatory Visit: Payer: Self-pay

## 2019-12-18 DIAGNOSIS — R1030 Lower abdominal pain, unspecified: Secondary | ICD-10-CM | POA: Diagnosis present

## 2019-12-18 MED ORDER — IOHEXOL 300 MG/ML  SOLN
100.0000 mL | Freq: Once | INTRAMUSCULAR | Status: AC | PRN
  Administered 2019-12-18: 100 mL via INTRAVENOUS

## 2019-12-22 ENCOUNTER — Encounter: Payer: Self-pay | Admitting: Urology

## 2019-12-22 ENCOUNTER — Other Ambulatory Visit: Payer: Self-pay

## 2019-12-22 ENCOUNTER — Ambulatory Visit: Payer: Managed Care, Other (non HMO) | Admitting: Urology

## 2019-12-22 VITALS — BP 122/75 | HR 74 | Ht 66.0 in | Wt 147.8 lb

## 2019-12-22 DIAGNOSIS — C61 Malignant neoplasm of prostate: Secondary | ICD-10-CM | POA: Diagnosis not present

## 2019-12-22 NOTE — Progress Notes (Signed)
12/22/2019 1:04 PM   Aaron Barron [birth year] [id number]  Referring provider: Kirk Ruths, MD [facility] [place],  [place] [address]  Chief Complaint  Patient presents with  . Prostate Cancer    Urologic history: 1.  Prostate cancer - pT2c N0 M0 status post RALP 03/2019, Gleason 3+4, (-) margins -Biochemical recurrence 2019; negative CT -Salvage radiation completed June 2019  HPI: 64 y.o. male presents for follow-up.  -Last seen here 10/2017 prior to salvage radiation -Last visit with radiation oncology 05/2018 with PSA 0.01 -Mild urinary frequency, not bothersome -Denies dysuria, gross hematuria -Recent PSA Dr. Tonette Bihari office 0.1 -CT ordered for lower abdominal discomfort  PSA trend:     PMH: Past Medical History:  Diagnosis Date  . Glaucoma   . Hypercholesteremia   . Hyperlipidemia   . Prostate cancer [facility])     Surgical History: Past Surgical History:  Procedure Laterality Date  . APPENDECTOMY    . COLONOSCOPY WITH PROPOFOL N/A 10/28/2015   Procedure: COLONOSCOPY WITH PROPOFOL;  Surgeon: Aaron Sails, MD;  Location: [facility] ENDOSCOPY;  Service: Endoscopy;  Laterality: N/A;  . HERNIA REPAIR    . PROSTATECTOMY     "it was cancer"    Home Medications:  Allergies as of 12/22/2019      Reactions   Percocet [oxycodone-acetaminophen] Other (See Comments)   Dizzy,and clammy      Medication List       Accurate as of Dec 22, 2019  1:04 PM. If you have any questions, ask your nurse or doctor.        STOP taking these medications   phenazopyridine 200 MG tablet Commonly known as: Pyridium Stopped by: Aaron Sons, MD     TAKE these medications   aspirin 81 MG tablet Take 81 mg by mouth daily.   azelastine 0.1 % nasal spray Commonly known as: ASTELIN Place into both nostrils 2 (two) times daily. Use in each nostril as directed   finasteride 5 MG tablet Commonly known as: PROSCAR Take 5 mg by  mouth daily. Reported on 10/28/2015   pravastatin 40 MG tablet Commonly known as: PRAVACHOL Take 40 mg by mouth daily.   Timolol Maleate 0.5 % (DAILY) Soln Apply to eye 2 (two) times daily.   valACYclovir 1000 MG tablet Commonly known as: VALTREX Take 1,000 mg by mouth 3 (three) times daily.       Allergies:  Allergies  Allergen Reactions  . Percocet [Oxycodone-Acetaminophen] Other (See Comments)    Dizzy,and clammy    Family History: History reviewed. No pertinent family history.  Social History:  reports that he has been smoking cigarettes. He has a 30.00 pack-year smoking history. He has never used smokeless tobacco. He reports that he does not drink alcohol or use drugs.   Physical Exam: BP 122/75   Pulse 74   Ht 5\' 6"  (1.676 m)   Wt 147 lb 12.8 oz (67 kg)   BMI 23.86 kg/m   Constitutional:  Alert and oriented, No acute distress. HEENT: [place] AT, moist mucus membranes.  Trachea midline, no masses. Cardiovascular: No clubbing, cyanosis, or edema. Respiratory: Normal respiratory effort, no increased work of breathing. Skin: No rashes, bruises or suspicious lesions. Neurologic: Grossly intact, no focal deficits, moving all 4 extremities. Psychiatric: Normal mood and affect.   Assessment & Plan:    1.  Prostate cancer -Rising PSA after salvage radiation.  Recent CT scan without pelvic adenopathy.  Mild bladder  wall thickening noted as well as mild thickening along the right vesicourethral anastomosis.  Incidentally noted to have a 3 mm right upper pole renal calculus.  I did discuss his increasing PSA is worrisome for persistent disease.  The area of the vesicourethral anastomosis would have been within the radiation field.  Management options were discussed including ADT, surveillance and medical oncology referral.  He does not desire any further evaluation at present.  Recommend a repeat PSA in July 2021 which he states he will have done in Dr. Tonette Bihari office.  If  PSA rising consider Axumin scan.   Aaron Barron, [facility] [address], [place], [place] [address] [phone]

## 2019-12-24 ENCOUNTER — Encounter: Payer: Self-pay | Admitting: Urology

## 2020-10-04 ENCOUNTER — Other Ambulatory Visit: Payer: Self-pay

## 2020-10-04 ENCOUNTER — Ambulatory Visit: Payer: 59 | Admitting: Urology

## 2020-10-04 ENCOUNTER — Encounter: Payer: Self-pay | Admitting: Urology

## 2020-10-04 VITALS — BP 117/72 | HR 76 | Ht 66.0 in | Wt 158.0 lb

## 2020-10-04 DIAGNOSIS — C61 Malignant neoplasm of prostate: Secondary | ICD-10-CM

## 2020-10-04 NOTE — Progress Notes (Signed)
10/04/2020 1:00 PM   SENDER RUEB 11-May-1956 [id number]  Referring provider: Kirk Ruths, MD [facility] [place],  [place] [address]  Chief Complaint  Patient presents with  . Prostate Cancer    Urologic history: 1.  Prostate cancer - pT2c N0 M0 status post RALP 03/2019, Gleason 3+4, (-) margins -Biochemical recurrence 2019; negative CT -Salvage radiation completed June 2019  HPI: 65 y.o. male presents for annual follow-up.   No complaints and states he is doing well  PSA 09/27/2020 0.15 (0.11 November 2019)  No bothersome LUTS  Denies dysuria, gross hematuria  Denies flank, abdominal or pelvic pain   PMH: Past Medical History:  Diagnosis Date  . Glaucoma   . Hypercholesteremia   . Hyperlipidemia   . Prostate cancer [facility])     Surgical History: Past Surgical History:  Procedure Laterality Date  . APPENDECTOMY    . COLONOSCOPY WITH PROPOFOL N/A 10/28/2015   Procedure: COLONOSCOPY WITH PROPOFOL;  Surgeon: Lollie Sails, MD;  Location: [facility] ENDOSCOPY;  Service: Endoscopy;  Laterality: N/A;  . HERNIA REPAIR    . PROSTATECTOMY     "it was cancer"    Home Medications:  Allergies as of 10/04/2020      Reactions   Percocet [oxycodone-acetaminophen] Other (See Comments)   Dizzy,and clammy      Medication List       Accurate as of October 04, 2020  1:00 PM. If you have any questions, ask your nurse or doctor.        aspirin 81 MG tablet Take 81 mg by mouth daily.   azelastine 0.1 % nasal spray Commonly known as: ASTELIN Place into both nostrils 2 (two) times daily. Use in each nostril as directed   finasteride 5 MG tablet Commonly known as: PROSCAR Take 5 mg by mouth daily. Reported on 10/28/2015   pravastatin 40 MG tablet Commonly known as: PRAVACHOL Take 40 mg by mouth daily.   Timolol Maleate (Once-Daily) 0.5 % Soln Apply to eye 2 (two) times daily.   valACYclovir 1000 MG tablet Commonly known as:  VALTREX Take 1,000 mg by mouth 3 (three) times daily.       Allergies:  Allergies  Allergen Reactions  . Percocet [Oxycodone-Acetaminophen] Other (See Comments)    Dizzy,and clammy    Family History: No family history on file.  Social History:  reports that he has been smoking cigarettes. He has a 30.00 pack-year smoking history. He has never used smokeless tobacco. He reports that he does not drink alcohol and does not use drugs.   Physical Exam: BP 117/72   Pulse 76   Ht 5\' 6"  (1.676 m)   Wt 158 lb (71.7 kg)   BMI 25.50 kg/m   Constitutional:  Alert and oriented, No acute distress. HEENT: [place] AT, moist mucus membranes.  Trachea midline, no masses. Cardiovascular: No clubbing, cyanosis, or edema. Respiratory: Normal respiratory effort, no increased work of breathing.   Assessment & Plan:    1. Prostate cancer  Biochemical recurrence for intermediate risk disease status post RALP  Salvage radiation 2019  Rising PSA detected April 2021  No significant change in PSA from April 2021  He desires to continue surveillance  He states he is scheduled for a appointment with Dr. Ouida Sills and August 2022 and PSA will be checked at that time  Follow-up with me 1 year   Abbie Sons, MD  [facility] [address],  [place], [place] [address] [phone]

## 2020-10-05 ENCOUNTER — Encounter: Payer: Self-pay | Admitting: Urology

## 2021-10-06 ENCOUNTER — Ambulatory Visit: Payer: Self-pay | Admitting: Urology

## 2021-11-05 ENCOUNTER — Ambulatory Visit (INDEPENDENT_AMBULATORY_CARE_PROVIDER_SITE_OTHER): Payer: 59 | Admitting: Urology

## 2021-11-05 ENCOUNTER — Encounter: Payer: Self-pay | Admitting: Urology

## 2021-11-05 VITALS — BP 122/76 | HR 75 | Ht 66.0 in | Wt 156.0 lb

## 2021-11-05 DIAGNOSIS — C61 Malignant neoplasm of prostate: Secondary | ICD-10-CM

## 2021-11-05 NOTE — Progress Notes (Signed)
? ?11/05/2021 ?9:33 AM  ? ?Aaron Barron ?[birth year] ?[id number] ? ?Referring provider: Kirk Ruths, MD ?[place],  [place] [address] ? ?Chief Complaint  ?Patient presents with  ? Elevated PSA  ? ? ?Urologic history: ?1.  Prostate cancer ?- pT2c N0 M0 status post RALP 03/2019, Gleason 3+4, (-) margins ?-Biochemical recurrence 2019; negative CT ?-Salvage radiation completed June 2019 ? ?HPI: ?66 y.o. male presents for annual follow-up. ? ?No complaints and states he is doing well ?PSA 09/27/2020 0.15 (0.11 November 2019) ?PSA 03/2021 had increased to 0.34 and most recent PSA 10/17/2021 was 0.45 ?No bothersome LUTS ?Denies dysuria, gross hematuria ?Denies flank, abdominal or pelvic pain ? ? ?PMH: ?Past Medical History:  ?Diagnosis Date  ? Glaucoma   ? Hypercholesteremia   ? Hyperlipidemia   ? Prostate cancer ([place])   ? ? ?Surgical History: ?Past Surgical History:  ?Procedure Laterality Date  ? APPENDECTOMY    ? COLONOSCOPY WITH PROPOFOL N/A 10/28/2015  ? Procedure: COLONOSCOPY WITH PROPOFOL;  Surgeon: Lollie Sails, MD;  Location: [facility] ENDOSCOPY;  Service: Endoscopy;  Laterality: N/A;  ? HERNIA REPAIR    ? PROSTATECTOMY    ? "it was cancer"  ? ? ?Home Medications:  ?Allergies as of 11/05/2021   ? ?   Reactions  ? Percocet [oxycodone-acetaminophen] Other (See Comments)  ? Dizzy,and clammy  ? ?  ? ?  ?Medication List  ?  ? ?  ? Accurate as of November 05, 2021  9:33 AM. If you have any questions, ask your nurse or doctor.  ?  ?  ? ?  ? ?aspirin 81 MG tablet ?Take 81 mg by mouth daily. ?  ?azelastine 0.1 % nasal spray ?Commonly known as: ASTELIN ?Place into both nostrils 2 (two) times daily. Use in each nostril as directed ?  ?finasteride 5 MG tablet ?Commonly known as: PROSCAR ?Take 5 mg by mouth daily. Reported on 10/28/2015 ?  ?pravastatin 40 MG tablet ?Commonly known as: PRAVACHOL ?Take 40 mg by mouth daily. ?  ?Timolol Maleate (Once-Daily) 0.5 % Soln ?Apply to eye 2 (two)  times daily. ?  ?valACYclovir 1000 MG tablet ?Commonly known as: VALTREX ?Take 1,000 mg by mouth 3 (three) times daily. ?  ? ?  ? ? ?Allergies:  ?Allergies  ?Allergen Reactions  ? Percocet [Oxycodone-Acetaminophen] Other (See Comments)  ?  Dizzy,and clammy  ? ? ?Family History: ?History reviewed. No pertinent family history. ? ?Social History:  reports that he has been smoking cigarettes. He has a 30.00 pack-year smoking history. He has never used smokeless tobacco. He reports that he does not drink alcohol and does not use drugs. ? ? ?Physical Exam: ?BP 122/76   Pulse 75   Ht '5\' 6"'$  (1.676 m)   Wt 156 lb (70.8 kg)   BMI 25.18 kg/m?   ?Constitutional:  Alert and oriented, No acute distress. ?HEENT: [place] AT, moist mucus membranes.  Trachea midline, no masses. ?Cardiovascular: No clubbing, cyanosis, or edema. ?Respiratory: Normal respiratory effort, no increased work of breathing. ? ? ?Assessment & Plan:   ? ?1. Prostate cancer ?Biochemical recurrence for intermediate risk disease status post RALP ?Salvage radiation 2019 ?Rising PSA detected April 2021 ?PSA level has tripled between February 2022 and March 2023 ?I recommended scheduling PSMA/PET however his wife states their insurance will not cover this imaging.  Unlikely conventional imaging with CT bone scan would be beneficial at a PSA this level ?He has had radical  prostatectomy and salvage radiation and does not desire to start ADT ?His wife states their insurance will change after the first of the year and scanning may be covered at that time ?They have elected to continue surveillance and will follow-up with me in 6 months ? ?Abbie Sons, MD ? ?[facility] ?[address] ?[place], [place] [address] ?[phone] ? ?

## 2022-05-08 ENCOUNTER — Ambulatory Visit: Payer: 59 | Admitting: Urology

## 2024-01-11 DIAGNOSIS — Z23 Encounter for immunization: Secondary | ICD-10-CM | POA: Diagnosis not present

## 2024-01-11 DIAGNOSIS — Z0000 Encounter for general adult medical examination without abnormal findings: Secondary | ICD-10-CM | POA: Diagnosis not present

## 2024-01-11 DIAGNOSIS — F17210 Nicotine dependence, cigarettes, uncomplicated: Secondary | ICD-10-CM | POA: Diagnosis not present

## 2024-01-11 DIAGNOSIS — Z125 Encounter for screening for malignant neoplasm of prostate: Secondary | ICD-10-CM | POA: Diagnosis not present

## 2024-01-11 DIAGNOSIS — G8929 Other chronic pain: Secondary | ICD-10-CM | POA: Diagnosis not present

## 2024-01-11 DIAGNOSIS — Z1331 Encounter for screening for depression: Secondary | ICD-10-CM | POA: Diagnosis not present

## 2024-01-11 DIAGNOSIS — R799 Abnormal finding of blood chemistry, unspecified: Secondary | ICD-10-CM | POA: Diagnosis not present

## 2024-01-11 DIAGNOSIS — I779 Disorder of arteries and arterioles, unspecified: Secondary | ICD-10-CM | POA: Diagnosis not present

## 2024-01-11 DIAGNOSIS — M25511 Pain in right shoulder: Secondary | ICD-10-CM | POA: Diagnosis not present

## 2024-01-11 DIAGNOSIS — R7303 Prediabetes: Secondary | ICD-10-CM | POA: Diagnosis not present

## 2024-01-14 ENCOUNTER — Other Ambulatory Visit: Payer: Self-pay | Admitting: Internal Medicine

## 2024-01-14 DIAGNOSIS — Z0000 Encounter for general adult medical examination without abnormal findings: Secondary | ICD-10-CM

## 2024-01-14 DIAGNOSIS — Z87891 Personal history of nicotine dependence: Secondary | ICD-10-CM

## 2024-01-24 ENCOUNTER — Ambulatory Visit
Admission: RE | Admit: 2024-01-24 | Discharge: 2024-01-24 | Disposition: A | Discharge status: Home / Self Care | Payer: Medicare (Managed Care) | Source: Ambulatory Visit | Attending: Internal Medicine | Admitting: Internal Medicine

## 2024-01-24 DIAGNOSIS — Z87891 Personal history of nicotine dependence: Secondary | ICD-10-CM | POA: Diagnosis not present

## 2024-01-24 DIAGNOSIS — Z0000 Encounter for general adult medical examination without abnormal findings: Secondary | ICD-10-CM | POA: Insufficient documentation

## 2024-02-21 ENCOUNTER — Ambulatory Visit: Payer: Medicare (Managed Care) | Admitting: Urology

## 2024-02-28 ENCOUNTER — Encounter: Payer: Self-pay | Admitting: Urology

## 2024-04-04 ENCOUNTER — Ambulatory Visit: Payer: Medicare (Managed Care)

## 2024-04-04 DIAGNOSIS — Z1211 Encounter for screening for malignant neoplasm of colon: Secondary | ICD-10-CM | POA: Diagnosis not present

## 2024-04-04 DIAGNOSIS — K640 First degree hemorrhoids: Secondary | ICD-10-CM | POA: Diagnosis not present

## 2024-04-04 DIAGNOSIS — Z860101 Personal history of adenomatous and serrated colon polyps: Secondary | ICD-10-CM | POA: Diagnosis not present

## 2024-04-04 DIAGNOSIS — K635 Polyp of colon: Secondary | ICD-10-CM | POA: Diagnosis not present

## 2024-07-13 DIAGNOSIS — I779 Disorder of arteries and arterioles, unspecified: Secondary | ICD-10-CM | POA: Diagnosis not present

## 2024-07-13 DIAGNOSIS — G3184 Mild cognitive impairment, so stated: Secondary | ICD-10-CM | POA: Diagnosis not present

## 2024-07-13 DIAGNOSIS — R972 Elevated prostate specific antigen [PSA]: Secondary | ICD-10-CM | POA: Diagnosis not present

## 2024-07-13 DIAGNOSIS — E782 Mixed hyperlipidemia: Secondary | ICD-10-CM | POA: Diagnosis not present

## 2024-07-13 DIAGNOSIS — R7303 Prediabetes: Secondary | ICD-10-CM | POA: Diagnosis not present

## 2024-07-13 DIAGNOSIS — F17210 Nicotine dependence, cigarettes, uncomplicated: Secondary | ICD-10-CM | POA: Diagnosis not present

## 2024-07-13 DIAGNOSIS — R911 Solitary pulmonary nodule: Secondary | ICD-10-CM | POA: Diagnosis not present

## 2024-07-13 DIAGNOSIS — Z23 Encounter for immunization: Secondary | ICD-10-CM | POA: Diagnosis not present

## 2024-07-13 DIAGNOSIS — Z125 Encounter for screening for malignant neoplasm of prostate: Secondary | ICD-10-CM | POA: Diagnosis not present

## 2024-07-14 ENCOUNTER — Other Ambulatory Visit: Payer: Self-pay | Admitting: Internal Medicine

## 2024-07-14 DIAGNOSIS — R7303 Prediabetes: Secondary | ICD-10-CM

## 2024-07-14 DIAGNOSIS — R911 Solitary pulmonary nodule: Secondary | ICD-10-CM

## 2024-07-21 ENCOUNTER — Ambulatory Visit
Admission: RE | Admit: 2024-07-21 | Discharge: 2024-07-21 | Disposition: A | Discharge status: Home / Self Care | Payer: Medicare (Managed Care) | Source: Ambulatory Visit | Attending: Internal Medicine | Admitting: Internal Medicine

## 2024-07-21 DIAGNOSIS — R7303 Prediabetes: Secondary | ICD-10-CM | POA: Insufficient documentation

## 2024-07-21 DIAGNOSIS — R911 Solitary pulmonary nodule: Secondary | ICD-10-CM | POA: Insufficient documentation

## 2024-08-22 ENCOUNTER — Encounter: Payer: Self-pay | Admitting: Urology

## 2024-08-22 ENCOUNTER — Ambulatory Visit: Payer: Medicare (Managed Care) | Admitting: Urology

## 2024-08-22 VITALS — BP 137/82 | HR 60 | Ht 66.0 in | Wt 160.0 lb

## 2024-08-22 DIAGNOSIS — C61 Malignant neoplasm of prostate: Secondary | ICD-10-CM | POA: Diagnosis not present

## 2024-08-22 NOTE — Progress Notes (Signed)
 Patient presents for an office visit. BP today is High. Greater than 140/90. Provider  notified and recheck Blood Pressure .  Pt advised to talk with PCP.  Pt voiced understanding.

## 2024-08-22 NOTE — Progress Notes (Signed)
" ° °  08/22/2024 8:15 AM   Aaron Barron 10-29-1955 [id number]  Referring provider: Lenon Layman ORN, MD [address] [facility] [place],  [place] [address]  Chief Complaint  Patient presents with   Prostate Cancer   Urologic history: 1.  Prostate cancer pT2c N0 M0 status post RALP 03/2019, Gleason 3+4, (-) margins Biochemical recurrence 2019; negative CT Salvage radiation completed June 2019   HPI: Aaron Barron is a 69 y.o. male referred for rising PSA.  PSA 01/11/2024 was 1.42 and repeat PSA 07/13/2024 was 1.63 No bothersome LUTS No dysuria or gross hematuria   PMH: Past Medical History:  Diagnosis Date   Glaucoma    Hypercholesteremia    Hyperlipidemia    Prostate cancer [facility])     Surgical History: Past Surgical History:  Procedure Laterality Date   APPENDECTOMY     COLONOSCOPY WITH PROPOFOL  N/A 10/28/2015   Procedure: COLONOSCOPY WITH PROPOFOL ;  Surgeon: Aaron RAYMOND Mariner, MD;  Location: [facility] ENDOSCOPY;  Service: Endoscopy;  Laterality: N/A;   HERNIA REPAIR     PROSTATECTOMY     it was cancer    Home Medications:  Allergies as of 08/22/2024       Reactions   Percocet [oxycodone-acetaminophen] Other (See Comments)   Dizzy,and clammy        Medication List        Accurate as of August 22, 2024  8:15 AM. If you have any questions, ask your nurse or doctor.          aspirin 81 MG tablet Take 81 mg by mouth daily.   azelastine 0.1 % nasal spray Commonly known as: ASTELIN Place into both nostrils 2 (two) times daily. Use in each nostril as directed   finasteride 5 MG tablet Commonly known as: PROSCAR Take 5 mg by mouth daily. Reported on 10/28/2015   pravastatin 40 MG tablet Commonly known as: PRAVACHOL Take 40 mg by mouth daily.   Timolol Maleate (Once-Daily) 0.5 % Soln Apply to eye 2 (two) times daily.   valACYclovir 1000 MG tablet Commonly known as: VALTREX Take 1,000 mg by mouth 3 (three) times daily.         Allergies: Allergies[1]  Family History: No family history on file.  Social History:  reports that he has been smoking cigarettes. He has a 30 pack-year smoking history. He has never used smokeless tobacco. He reports that he does not drink alcohol and does not use drugs.   Physical Exam: BP (!) 147/80   Pulse 69   Ht 5' 6 (1.676 m)   Wt 160 lb (72.6 kg)   BMI 25.82 kg/m   Constitutional:  Alert, No acute distress. HEENT: [place] AT Respiratory: Normal respiratory effort, no increased work of breathing. Psychiatric: Normal mood and affect.   Assessment & Plan:    1.  Prostate cancer Discussed his rising PSA is concerning for recurrent prostate cancer Recommend PSMA/PET Will contact with results; status post radical prostatectomy and radiation and will refer to medical oncology after PSMA/PET   Aaron JAYSON Barba, MD  [facility] [address], [address] [place], [place] [address] [phone]     [1]  Allergies Allergen Reactions   Percocet [Oxycodone-Acetaminophen] Other (See Comments)    Dizzy,and clammy   "

## 2024-08-22 NOTE — Patient Instructions (Signed)
 Scheduling number: [phone]

## 2024-09-11 ENCOUNTER — Ambulatory Visit: Payer: Medicare (Managed Care)
